# Patient Record
Sex: Female | Born: 1965 | Race: Black or African American | Hispanic: Refuse to answer | Marital: Married | Smoking: Never smoker
Health system: Southern US, Community
[De-identification: ages and names within clinical notes are randomized; demographics above are authoritative.]

## PROBLEM LIST (undated history)

## (undated) DIAGNOSIS — L309 Dermatitis, unspecified: Secondary | ICD-10-CM

## (undated) HISTORY — PX: BREAST EXCISIONAL BIOPSY: SUR124

## (undated) HISTORY — PX: BREAST BIOPSY: SHX20

## (undated) HISTORY — PX: NO PAST SURGERIES: SHX2092

---

## 2004-02-14 ENCOUNTER — Emergency Department (HOSPITAL_COMMUNITY): Admission: EM | Admit: 2004-02-14 | Discharge: 2004-02-14 | Payer: Self-pay | Admitting: Family Medicine

## 2004-07-18 ENCOUNTER — Emergency Department (HOSPITAL_COMMUNITY): Admission: EM | Admit: 2004-07-18 | Discharge: 2004-07-18 | Payer: Self-pay | Admitting: Family Medicine

## 2005-05-08 ENCOUNTER — Other Ambulatory Visit: Admission: RE | Admit: 2005-05-08 | Discharge: 2005-05-08 | Payer: Self-pay | Admitting: Family Medicine

## 2005-05-23 ENCOUNTER — Ambulatory Visit (HOSPITAL_COMMUNITY): Admission: RE | Admit: 2005-05-23 | Discharge: 2005-05-23 | Payer: Self-pay | Admitting: Family Medicine

## 2006-05-27 ENCOUNTER — Emergency Department (HOSPITAL_COMMUNITY): Admission: EM | Admit: 2006-05-27 | Discharge: 2006-05-27 | Payer: Self-pay | Admitting: Family Medicine

## 2007-03-02 ENCOUNTER — Other Ambulatory Visit: Admission: RE | Admit: 2007-03-02 | Discharge: 2007-03-02 | Payer: Self-pay | Admitting: Family Medicine

## 2007-03-26 ENCOUNTER — Emergency Department (HOSPITAL_COMMUNITY): Admission: EM | Admit: 2007-03-26 | Discharge: 2007-03-26 | Payer: Self-pay | Admitting: Emergency Medicine

## 2007-04-06 ENCOUNTER — Encounter: Admission: RE | Admit: 2007-04-06 | Discharge: 2007-04-06 | Payer: Self-pay | Admitting: Family Medicine

## 2009-05-29 ENCOUNTER — Encounter: Admission: RE | Admit: 2009-05-29 | Discharge: 2009-05-29 | Payer: Self-pay | Admitting: Family Medicine

## 2009-09-25 ENCOUNTER — Other Ambulatory Visit: Admission: RE | Admit: 2009-09-25 | Discharge: 2009-09-25 | Payer: Self-pay | Admitting: Family Medicine

## 2010-04-08 ENCOUNTER — Encounter: Payer: Self-pay | Admitting: Family Medicine

## 2011-05-07 ENCOUNTER — Emergency Department (INDEPENDENT_AMBULATORY_CARE_PROVIDER_SITE_OTHER): Payer: PRIVATE HEALTH INSURANCE

## 2011-05-07 ENCOUNTER — Encounter (HOSPITAL_COMMUNITY): Payer: Self-pay | Admitting: Emergency Medicine

## 2011-05-07 ENCOUNTER — Emergency Department (INDEPENDENT_AMBULATORY_CARE_PROVIDER_SITE_OTHER)
Admission: EM | Admit: 2011-05-07 | Discharge: 2011-05-07 | Disposition: A | Discharge status: Home / Self Care | Payer: PRIVATE HEALTH INSURANCE | Source: Home / Self Care | Attending: Emergency Medicine | Admitting: Emergency Medicine

## 2011-05-07 DIAGNOSIS — N200 Calculus of kidney: Secondary | ICD-10-CM

## 2011-05-07 LAB — POCT URINALYSIS DIP (DEVICE)
Leukocytes, UA: NEGATIVE
Protein, ur: NEGATIVE mg/dL
Specific Gravity, Urine: 1.025 (ref 1.005–1.030)
pH: 6 (ref 5.0–8.0)

## 2011-05-07 LAB — POCT PREGNANCY, URINE: Preg Test, Ur: NEGATIVE

## 2011-05-07 MED ORDER — KETOROLAC TROMETHAMINE 60 MG/2ML IM SOLN: 60.0000 mg | Freq: Once | INTRAMUSCULAR | Status: DC

## 2011-05-07 MED ORDER — OXYCODONE-ACETAMINOPHEN 5-325 MG PO TABS: ORAL_TABLET | ORAL | Status: AC

## 2011-05-07 MED ORDER — ONDANSETRON 8 MG PO TBDP: 8.0000 mg | ORAL_TABLET | Freq: Three times a day (TID) | ORAL | Status: AC | PRN

## 2011-05-07 MED ORDER — TAMSULOSIN HCL 0.4 MG PO CAPS: 0.4000 mg | ORAL_CAPSULE | Freq: Every day | ORAL | Status: DC

## 2011-05-07 NOTE — ED Notes (Signed)
PT HERE WITH LEFT FLANK/BACK CONSTANT,SHARP PAINS THAT STARTED LAST NIGHT UNRELIEVED BY STRETCHING OR HEAT.NAUSEA ALSO REPORTED BUT NO FEVERS.C/O FREQ/URGE WITH URINATION BUT DENIES BURN OR ITCH,VAG D/C

## 2011-05-07 NOTE — ED Provider Notes (Signed)
Chief Complaint  Patient presents with  . Urinary Tract Infection  . Back Pain    History of Present Illness:   Natasha Donovan is a 46 year old female who last night noted a sudden onset of severe left CVA pain rated 9/10 in intensity which radiated toward her groin. She felt somewhat nauseated but did not vomit. She denies any fever or chills. She has had no dysuria, frequency, urgency, or hematuria. She's had or GYN complaints. Her menses have been slightly irregular but she attributes this to be perimenopausal. She denies any prior history of kidney stones or infections.  Review of Systems:  Other than noted above, the patient denies any of the following symptoms: General:  No fevers, chills, sweats, aches, or fatigue. GI:  No abdominal pain, back pain, nausea, vomiting, diarrhea, or constipation. GU:  No dysuria, frequency, urgency, hematuria, or incontinence. GYN:  No discharge, itching, vulvar pain or lesions, pelvic pain, or abnormal vaginal bleeding.  PMFSH:  Past medical history, family history, social history, meds, and allergies were reviewed.  Physical Exam:   Vital signs:  BP 139/80  Pulse 86  Temp(Src) 98 F (36.7 C) (Oral)  Resp 16  SpO2 100%  LMP 04/04/2011 Gen:  Alert, oriented, in no distress. Lungs:  Clear to auscultation, no wheezes, rales or rhonchi. Heart:  Regular rhythm, no gallop or murmer. Abdomen:  Flat and soft. There was slight suprapubic pain to palpation.  No guarding, or rebound.  No hepato-splenomegaly or mass.  Bowel sounds were normally active.  No hernia. Back:  Mild left CVA tenderness.  Skin:  Clear, warm and dry.  Labs:   Results for orders placed during the hospital encounter of 05/07/11  POCT URINALYSIS DIP (DEVICE)      Component Value Range   Glucose, UA NEGATIVE  NEGATIVE (mg/dL)   Bilirubin Urine NEGATIVE  NEGATIVE    Ketones, ur NEGATIVE  NEGATIVE (mg/dL)   Specific Gravity, Urine 1.025  1.005 - 1.030    Hgb urine dipstick MODERATE (*)  NEGATIVE    pH 6.0  5.0 - 8.0    Protein, ur NEGATIVE  NEGATIVE (mg/dL)   Urobilinogen, UA 0.2  0.0 - 1.0 (mg/dL)   Nitrite NEGATIVE  NEGATIVE    Leukocytes, UA NEGATIVE  NEGATIVE   POCT PREGNANCY, URINE      Component Value Range   Preg Test, Ur NEGATIVE  NEGATIVE      Assessment:  Diagnoses that have been ruled out:  None  Diagnoses that are still under consideration:  None  Final diagnoses:  Kidney stone     Plan:   1.  The following meds were prescribed:   New Prescriptions   ONDANSETRON (ZOFRAN ODT) 8 MG DISINTEGRATING TABLET    Take 1 tablet (8 mg total) by mouth every 8 (eight) hours as needed for nausea.   OXYCODONE-ACETAMINOPHEN (PERCOCET) 5-325 MG PER TABLET    1 to 2 tablets every 6 hours as needed for pain.   TAMSULOSIN HCL (FLOMAX) 0.4 MG CAPS    Take 1 capsule (0.4 mg total) by mouth daily.   2.  The patient was instructed in symptomatic care and handouts were given. 3.  The patient was told to return if becoming worse in any way, if no better in 3 or 4 days, and given some red flag symptoms that would indicate earlier return. 4.  The patient was told to get extra fluids, take medications as directed, and followup with a urologist if no improvement in 2  or 3 days.   Roque Lias, MD 05/07/11 [phone]

## 2011-05-07 NOTE — Discharge Instructions (Signed)

## 2011-05-08 LAB — URINE CULTURE
Colony Count: 25000
Culture  Setup Time: 201302191243

## 2011-05-13 ENCOUNTER — Ambulatory Visit
Admission: RE | Admit: 2011-05-13 | Discharge: 2011-05-13 | Disposition: A | Discharge status: Home / Self Care | Payer: PRIVATE HEALTH INSURANCE | Source: Ambulatory Visit | Attending: Family Medicine | Admitting: Family Medicine

## 2011-05-13 ENCOUNTER — Other Ambulatory Visit: Payer: Self-pay | Admitting: Family Medicine

## 2011-05-13 DIAGNOSIS — R1032 Left lower quadrant pain: Secondary | ICD-10-CM

## 2011-05-14 ENCOUNTER — Other Ambulatory Visit: Payer: Self-pay | Admitting: Family Medicine

## 2011-05-14 DIAGNOSIS — R109 Unspecified abdominal pain: Secondary | ICD-10-CM

## 2011-05-17 ENCOUNTER — Ambulatory Visit
Admission: RE | Admit: 2011-05-17 | Discharge: 2011-05-17 | Disposition: A | Discharge status: Home / Self Care | Payer: PRIVATE HEALTH INSURANCE | Source: Ambulatory Visit | Attending: Family Medicine | Admitting: Family Medicine

## 2011-05-17 DIAGNOSIS — R109 Unspecified abdominal pain: Secondary | ICD-10-CM

## 2011-05-17 MED ORDER — GADOBENATE DIMEGLUMINE 529 MG/ML IV SOLN
18.0000 mL | Freq: Once | INTRAVENOUS | Status: AC | PRN
  Administered 2011-05-17: 18 mL via INTRAVENOUS

## 2011-10-04 ENCOUNTER — Other Ambulatory Visit: Payer: Self-pay | Admitting: Gastroenterology

## 2011-10-04 DIAGNOSIS — R109 Unspecified abdominal pain: Secondary | ICD-10-CM

## 2011-10-18 ENCOUNTER — Ambulatory Visit
Admission: RE | Admit: 2011-10-18 | Discharge: 2011-10-18 | Disposition: A | Discharge status: Home / Self Care | Payer: PRIVATE HEALTH INSURANCE | Source: Ambulatory Visit | Attending: Gastroenterology | Admitting: Gastroenterology

## 2011-10-18 ENCOUNTER — Other Ambulatory Visit: Payer: Self-pay | Admitting: Obstetrics and Gynecology

## 2011-10-18 ENCOUNTER — Other Ambulatory Visit (HOSPITAL_COMMUNITY)
Admission: RE | Admit: 2011-10-18 | Discharge: 2011-10-18 | Disposition: A | Discharge status: Home / Self Care | Payer: PRIVATE HEALTH INSURANCE | Source: Ambulatory Visit | Attending: Obstetrics and Gynecology | Admitting: Obstetrics and Gynecology

## 2011-10-18 DIAGNOSIS — Z01419 Encounter for gynecological examination (general) (routine) without abnormal findings: Secondary | ICD-10-CM | POA: Insufficient documentation

## 2011-10-18 DIAGNOSIS — R109 Unspecified abdominal pain: Secondary | ICD-10-CM

## 2011-11-05 ENCOUNTER — Other Ambulatory Visit: Payer: Self-pay | Admitting: Obstetrics and Gynecology

## 2012-10-23 ENCOUNTER — Emergency Department (HOSPITAL_COMMUNITY)
Admission: EM | Admit: 2012-10-23 | Discharge: 2012-10-23 | Disposition: A | Discharge status: Home / Self Care | Payer: PRIVATE HEALTH INSURANCE | Attending: Emergency Medicine | Admitting: Emergency Medicine

## 2012-10-23 ENCOUNTER — Encounter (HOSPITAL_COMMUNITY): Payer: Self-pay

## 2012-10-23 DIAGNOSIS — R059 Cough, unspecified: Secondary | ICD-10-CM | POA: Insufficient documentation

## 2012-10-23 DIAGNOSIS — J069 Acute upper respiratory infection, unspecified: Secondary | ICD-10-CM | POA: Insufficient documentation

## 2012-10-23 DIAGNOSIS — J040 Acute laryngitis: Secondary | ICD-10-CM | POA: Insufficient documentation

## 2012-10-23 DIAGNOSIS — R05 Cough: Secondary | ICD-10-CM | POA: Insufficient documentation

## 2012-10-23 DIAGNOSIS — J029 Acute pharyngitis, unspecified: Secondary | ICD-10-CM | POA: Insufficient documentation

## 2012-10-23 DIAGNOSIS — R509 Fever, unspecified: Secondary | ICD-10-CM | POA: Insufficient documentation

## 2012-10-23 DIAGNOSIS — E119 Type 2 diabetes mellitus without complications: Secondary | ICD-10-CM | POA: Insufficient documentation

## 2012-10-23 DIAGNOSIS — Z79899 Other long term (current) drug therapy: Secondary | ICD-10-CM | POA: Insufficient documentation

## 2012-10-23 DIAGNOSIS — J3489 Other specified disorders of nose and nasal sinuses: Secondary | ICD-10-CM | POA: Insufficient documentation

## 2012-10-23 MED ORDER — MAGIC MOUTHWASH: 5.0000 mL | Freq: Four times a day (QID) | ORAL | Status: DC | PRN

## 2012-10-23 MED ORDER — GUAIFENESIN ER 600 MG PO TB12: 1200.0000 mg | ORAL_TABLET | Freq: Two times a day (BID) | ORAL | Status: DC | PRN

## 2012-10-23 MED ORDER — GUAIFENESIN ER 600 MG PO TB12
1200.0000 mg | ORAL_TABLET | Freq: Two times a day (BID) | ORAL | Status: DC | PRN
  Administered 2012-10-23: 1200 mg via ORAL
  Filled 2012-10-23: qty 2

## 2012-10-23 MED ORDER — GI COCKTAIL ~~LOC~~
30.0000 mL | Freq: Once | ORAL | Status: AC
  Administered 2012-10-23: 30 mL via ORAL
  Filled 2012-10-23: qty 30

## 2012-10-23 NOTE — ED Notes (Signed)
Pt states that she has had a sore throat x 1 week; pt states that she saw her MD on Tues and diagnosed with a virus but was told if did not get any better to get Z-Pack filled; pt began Z-Pack on Thurs, pt reports that she has taken the first dose of Z-Pack last om at 7pm; pt reports that her throat is not any better.

## 2012-10-23 NOTE — ED Notes (Signed)
Patient is alert and oriented x3.  She was given DC instructions and follow up visit instructions.  Patient gave verbal understanding. She was DC ambulatory under her own power to home.  V/S stable.  He was not showing any signs of distress on DC 

## 2012-10-23 NOTE — ED Provider Notes (Signed)
CSN: [id number]     Arrival date & time 10/23/12  0443 History     First MD Initiated Contact with Patient 10/23/12 0459     Chief Complaint  Patient presents with  . Sore Throat   (Consider location/radiation/quality/duration/timing/severity/associated sxs/prior Treatment) HPI 47 yo female presents to the ER from home with complaint of sore throat for one week.  Pt had some fevers a week ago, no recently.  Some laryngitis that is slowly resolving.  Pt was seen by MD on Tuesday, dx with virus and given rx for cough syrup and zpack to use if not improving.  She has been doing throat lozenges and hot tea. Pt feels that she is very congested,worse on the left.  Pt started zpack last night, reports she doesn't feel much better, like she is swallowing razor blades.  Past Medical History  Diagnosis Date  . Diabetes mellitus    History reviewed. No pertinent past surgical history. History reviewed. No pertinent family history. History  Substance Use Topics  . Smoking status: Never Smoker   . Smokeless tobacco: Not on file  . Alcohol Use: No   OB History   Grav Para Term Preterm Abortions TAB SAB Ect Mult Living                 Review of Systems  All other systems reviewed and are negative.    Allergies  Review of patient's allergies indicates no known allergies.  Home Medications   Current Outpatient Rx  Name  Route  Sig  Dispense  Refill  . Tamsulosin HCl (FLOMAX) 0.4 MG CAPS   Oral   Take 1 capsule (0.4 mg total) by mouth daily.   7 capsule   0    BP 164/80  Pulse 110  Temp(Src) 98.6 F (37 C) (Oral)  Resp 16  Ht 5\' 3"  (1.6 m)  Wt 200 lb (90.719 kg)  BMI 35.44 kg/m2  SpO2 97%  LMP 09/30/2012 Physical Exam  Nursing note and vitals reviewed. Constitutional: She is oriented to person, place, and time. She appears well-developed and well-nourished.  HENT:  Head: Normocephalic and atraumatic.  Right Ear: External ear normal.  Left Ear: External ear normal.   Nose: Nose normal.  Mouth/Throat: Oropharynx is clear and moist. No oropharyngeal exudate.  No erythema, exudate or swelling noted to posterior pharnyx  Eyes: Conjunctivae and EOM are normal. Pupils are equal, round, and reactive to light.  Neck: Normal range of motion. Neck supple. No JVD present. No tracheal deviation present. No thyromegaly present.  Mild ttp over left neck, no LAD or masses, no tracheal shift  Cardiovascular: Normal rate, regular rhythm, normal heart sounds and intact distal pulses.  Exam reveals no gallop and no friction rub.   No murmur heard. Pulmonary/Chest: Effort normal and breath sounds normal. No stridor. No respiratory distress. She has no wheezes. She has no rales. She exhibits no tenderness.  Abdominal: Soft. Bowel sounds are normal. She exhibits no distension and no mass. There is no tenderness. There is no rebound and no guarding.  Musculoskeletal: Normal range of motion. She exhibits no edema and no tenderness.  Lymphadenopathy:    She has no cervical adenopathy.  Neurological: She is alert and oriented to person, place, and time. She exhibits normal muscle tone. Coordination normal.  Skin: Skin is warm and dry. No rash noted. No erythema. No pallor.  Psychiatric: She has a normal mood and affect. Her behavior is normal. Judgment and thought content normal.  ED Course   Procedures (including critical care time)  Labs Reviewed - No data to display No results found. 1. Pharyngitis   2. Upper respiratory infection     MDM  47 yo female with sore throat x 1 week. She started abx yesterday.  No signs of strep on exam, no LAD.  Will try mucinex and magic mouthwash for symptoms.  Olivia Mackie, MD 10/23/12 [phone]

## 2013-10-25 ENCOUNTER — Other Ambulatory Visit (HOSPITAL_COMMUNITY)
Admission: RE | Admit: 2013-10-25 | Discharge: 2013-10-25 | Disposition: A | Discharge status: Home / Self Care | Payer: PRIVATE HEALTH INSURANCE | Source: Ambulatory Visit | Attending: Family Medicine | Admitting: Family Medicine

## 2013-10-25 ENCOUNTER — Other Ambulatory Visit: Payer: Self-pay | Admitting: Family Medicine

## 2013-10-25 DIAGNOSIS — Z01419 Encounter for gynecological examination (general) (routine) without abnormal findings: Secondary | ICD-10-CM | POA: Insufficient documentation

## 2013-10-26 LAB — CYTOLOGY - PAP

## 2014-05-18 ENCOUNTER — Other Ambulatory Visit: Payer: Self-pay

## 2014-05-18 DIAGNOSIS — Z1231 Encounter for screening mammogram for malignant neoplasm of breast: Secondary | ICD-10-CM

## 2014-05-19 ENCOUNTER — Ambulatory Visit
Admission: RE | Admit: 2014-05-19 | Discharge: 2014-05-19 | Disposition: A | Discharge status: Home / Self Care | Payer: PRIVATE HEALTH INSURANCE | Source: Ambulatory Visit

## 2014-05-19 DIAGNOSIS — Z1231 Encounter for screening mammogram for malignant neoplasm of breast: Secondary | ICD-10-CM

## 2014-09-15 ENCOUNTER — Other Ambulatory Visit: Payer: Self-pay | Admitting: Family Medicine

## 2014-09-15 ENCOUNTER — Ambulatory Visit
Admission: RE | Admit: 2014-09-15 | Discharge: 2014-09-15 | Disposition: A | Discharge status: Home / Self Care | Payer: PRIVATE HEALTH INSURANCE | Source: Ambulatory Visit | Attending: Family Medicine | Admitting: Family Medicine

## 2014-09-15 DIAGNOSIS — M542 Cervicalgia: Secondary | ICD-10-CM

## 2014-09-15 DIAGNOSIS — M549 Dorsalgia, unspecified: Secondary | ICD-10-CM

## 2015-06-06 ENCOUNTER — Other Ambulatory Visit: Payer: Self-pay | Admitting: Family Medicine

## 2015-06-06 ENCOUNTER — Other Ambulatory Visit (HOSPITAL_COMMUNITY)
Admission: RE | Admit: 2015-06-06 | Discharge: 2015-06-06 | Disposition: A | Discharge status: Home / Self Care | Payer: PRIVATE HEALTH INSURANCE | Source: Ambulatory Visit | Attending: Family Medicine | Admitting: Family Medicine

## 2015-06-06 DIAGNOSIS — Z113 Encounter for screening for infections with a predominantly sexual mode of transmission: Secondary | ICD-10-CM | POA: Insufficient documentation

## 2015-06-06 DIAGNOSIS — Z01419 Encounter for gynecological examination (general) (routine) without abnormal findings: Secondary | ICD-10-CM | POA: Insufficient documentation

## 2015-06-09 LAB — CYTOLOGY - PAP

## 2015-10-09 ENCOUNTER — Other Ambulatory Visit: Payer: Self-pay

## 2015-10-09 DIAGNOSIS — N6452 Nipple discharge: Secondary | ICD-10-CM

## 2015-10-10 ENCOUNTER — Other Ambulatory Visit: Payer: Self-pay | Admitting: Family Medicine

## 2015-10-10 DIAGNOSIS — N6452 Nipple discharge: Secondary | ICD-10-CM

## 2015-10-17 ENCOUNTER — Ambulatory Visit
Admission: RE | Admit: 2015-10-17 | Discharge: 2015-10-17 | Disposition: A | Discharge status: Home / Self Care | Payer: PRIVATE HEALTH INSURANCE | Source: Ambulatory Visit | Attending: Family Medicine | Admitting: Family Medicine

## 2015-10-17 DIAGNOSIS — N6452 Nipple discharge: Secondary | ICD-10-CM

## 2015-10-27 ENCOUNTER — Other Ambulatory Visit: Payer: Self-pay | Admitting: General Surgery

## 2015-11-16 ENCOUNTER — Encounter (HOSPITAL_BASED_OUTPATIENT_CLINIC_OR_DEPARTMENT_OTHER): Payer: Self-pay | Admitting: *Deleted

## 2015-11-23 NOTE — Progress Notes (Signed)
Pt given 8oz carton of boost breeze with written and verbal instructions to drink by 845 morning of surgery and no other liquids after midnight , pt voiced understanding , teach back used

## 2015-11-28 ENCOUNTER — Encounter (HOSPITAL_BASED_OUTPATIENT_CLINIC_OR_DEPARTMENT_OTHER): Payer: Self-pay | Admitting: *Deleted

## 2015-11-28 ENCOUNTER — Ambulatory Visit (HOSPITAL_BASED_OUTPATIENT_CLINIC_OR_DEPARTMENT_OTHER): Payer: No Typology Code available for payment source | Admitting: Anesthesiology

## 2015-11-28 ENCOUNTER — Encounter (HOSPITAL_BASED_OUTPATIENT_CLINIC_OR_DEPARTMENT_OTHER)
Admission: RE | Disposition: A | Discharge status: Home / Self Care | Payer: Self-pay | Source: Ambulatory Visit | Attending: General Surgery

## 2015-11-28 ENCOUNTER — Ambulatory Visit (HOSPITAL_BASED_OUTPATIENT_CLINIC_OR_DEPARTMENT_OTHER)
Admission: RE | Admit: 2015-11-28 | Discharge: 2015-11-28 | Disposition: A | Discharge status: Home / Self Care | Payer: No Typology Code available for payment source | Source: Ambulatory Visit | Attending: General Surgery | Admitting: General Surgery

## 2015-11-28 DIAGNOSIS — Z6836 Body mass index (BMI) 36.0-36.9, adult: Secondary | ICD-10-CM | POA: Insufficient documentation

## 2015-11-28 DIAGNOSIS — E669 Obesity, unspecified: Secondary | ICD-10-CM | POA: Insufficient documentation

## 2015-11-28 DIAGNOSIS — D241 Benign neoplasm of right breast: Secondary | ICD-10-CM | POA: Insufficient documentation

## 2015-11-28 DIAGNOSIS — N6452 Nipple discharge: Secondary | ICD-10-CM | POA: Insufficient documentation

## 2015-11-28 HISTORY — PX: BREAST DUCTAL SYSTEM EXCISION: SHX5242

## 2015-11-28 HISTORY — DX: Dermatitis, unspecified: L30.9

## 2015-11-28 SURGERY — EXCISION DUCTAL SYSTEM BREAST
Anesthesia: General | Site: Breast | Laterality: Right

## 2015-11-28 MED ORDER — OXYCODONE HCL 5 MG PO TABS: 5.0000 mg | ORAL_TABLET | Freq: Four times a day (QID) | ORAL | 0 refills | Status: DC | PRN

## 2015-11-28 MED ORDER — LIDOCAINE-EPINEPHRINE (PF) 1 %-1:200000 IJ SOLN
INTRAMUSCULAR | Status: DC | PRN
  Administered 2015-11-28: 10 mL

## 2015-11-28 MED ORDER — CEFAZOLIN SODIUM-DEXTROSE 2-4 GM/100ML-% IV SOLN
2.0000 g | INTRAVENOUS | Status: AC
  Administered 2015-11-28: 2 g via INTRAVENOUS

## 2015-11-28 MED ORDER — DEXAMETHASONE SODIUM PHOSPHATE 10 MG/ML IJ SOLN
INTRAMUSCULAR | Status: AC
  Filled 2015-11-28: qty 1

## 2015-11-28 MED ORDER — CELECOXIB 200 MG PO CAPS
ORAL_CAPSULE | ORAL | Status: AC
  Filled 2015-11-28: qty 2

## 2015-11-28 MED ORDER — CHLORHEXIDINE GLUCONATE CLOTH 2 % EX PADS: 6.0000 | MEDICATED_PAD | Freq: Once | CUTANEOUS | Status: DC

## 2015-11-28 MED ORDER — ACETAMINOPHEN 500 MG PO TABS
ORAL_TABLET | ORAL | Status: AC
  Filled 2015-11-28: qty 2

## 2015-11-28 MED ORDER — ACETAMINOPHEN 500 MG PO TABS
1000.0000 mg | ORAL_TABLET | ORAL | Status: AC
  Administered 2015-11-28: 1000 mg via ORAL

## 2015-11-28 MED ORDER — MIDAZOLAM HCL 2 MG/2ML IJ SOLN
INTRAMUSCULAR | Status: AC
  Filled 2015-11-28: qty 2

## 2015-11-28 MED ORDER — PROMETHAZINE HCL 25 MG/ML IJ SOLN: 6.2500 mg | INTRAMUSCULAR | Status: DC | PRN

## 2015-11-28 MED ORDER — PROPOFOL 500 MG/50ML IV EMUL
INTRAVENOUS | Status: AC
  Filled 2015-11-28: qty 50

## 2015-11-28 MED ORDER — GLYCOPYRROLATE 0.2 MG/ML IJ SOLN: 0.2000 mg | Freq: Once | INTRAMUSCULAR | Status: DC | PRN

## 2015-11-28 MED ORDER — FENTANYL CITRATE (PF) 100 MCG/2ML IJ SOLN
INTRAMUSCULAR | Status: DC | PRN
  Administered 2015-11-28: 25 ug via INTRAVENOUS
  Administered 2015-11-28: 100 ug via INTRAVENOUS
  Administered 2015-11-28: 25 ug via INTRAVENOUS

## 2015-11-28 MED ORDER — CEFAZOLIN SODIUM-DEXTROSE 2-4 GM/100ML-% IV SOLN
INTRAVENOUS | Status: AC
  Filled 2015-11-28: qty 100

## 2015-11-28 MED ORDER — ONDANSETRON HCL 4 MG/2ML IJ SOLN
INTRAMUSCULAR | Status: DC | PRN
  Administered 2015-11-28: 4 mg via INTRAVENOUS

## 2015-11-28 MED ORDER — MIDAZOLAM HCL 5 MG/5ML IJ SOLN
INTRAMUSCULAR | Status: DC | PRN
  Administered 2015-11-28: 2 mg via INTRAVENOUS

## 2015-11-28 MED ORDER — SODIUM CHLORIDE 0.9 % IJ SOLN
INTRAMUSCULAR | Status: AC
  Filled 2015-11-28: qty 10

## 2015-11-28 MED ORDER — FENTANYL CITRATE (PF) 100 MCG/2ML IJ SOLN: 25.0000 ug | INTRAMUSCULAR | Status: DC | PRN

## 2015-11-28 MED ORDER — CEFAZOLIN SODIUM 1 G IJ SOLR
INTRAMUSCULAR | Status: AC
  Filled 2015-11-28: qty 20

## 2015-11-28 MED ORDER — BUPIVACAINE-EPINEPHRINE (PF) 0.25% -1:200000 IJ SOLN
INTRAMUSCULAR | Status: AC
  Filled 2015-11-28: qty 30

## 2015-11-28 MED ORDER — LIDOCAINE-EPINEPHRINE (PF) 1 %-1:200000 IJ SOLN
INTRAMUSCULAR | Status: AC
  Filled 2015-11-28: qty 30

## 2015-11-28 MED ORDER — FENTANYL CITRATE (PF) 100 MCG/2ML IJ SOLN: 50.0000 ug | INTRAMUSCULAR | Status: DC | PRN

## 2015-11-28 MED ORDER — SCOPOLAMINE 1 MG/3DAYS TD PT72: 1.0000 | MEDICATED_PATCH | Freq: Once | TRANSDERMAL | Status: DC | PRN

## 2015-11-28 MED ORDER — ONDANSETRON HCL 4 MG/2ML IJ SOLN
INTRAMUSCULAR | Status: AC
  Filled 2015-11-28: qty 2

## 2015-11-28 MED ORDER — FENTANYL CITRATE (PF) 100 MCG/2ML IJ SOLN
INTRAMUSCULAR | Status: AC
  Filled 2015-11-28: qty 2

## 2015-11-28 MED ORDER — CELECOXIB 400 MG PO CAPS
400.0000 mg | ORAL_CAPSULE | ORAL | Status: AC
  Administered 2015-11-28: 400 mg via ORAL

## 2015-11-28 MED ORDER — MIDAZOLAM HCL 2 MG/2ML IJ SOLN: 1.0000 mg | INTRAMUSCULAR | Status: DC | PRN

## 2015-11-28 MED ORDER — BUPIVACAINE HCL (PF) 0.25 % IJ SOLN
INTRAMUSCULAR | Status: DC | PRN
  Administered 2015-11-28: 10 mL

## 2015-11-28 MED ORDER — BUPIVACAINE HCL (PF) 0.25 % IJ SOLN
INTRAMUSCULAR | Status: AC
  Filled 2015-11-28: qty 30

## 2015-11-28 MED ORDER — PROPOFOL 10 MG/ML IV BOLUS
INTRAVENOUS | Status: DC | PRN
  Administered 2015-11-28: 200 mg via INTRAVENOUS

## 2015-11-28 MED ORDER — LACTATED RINGERS IV SOLN
INTRAVENOUS | Status: DC
  Administered 2015-11-28 (×2): via INTRAVENOUS

## 2015-11-28 MED ORDER — LIDOCAINE HCL (CARDIAC) 20 MG/ML IV SOLN
INTRAVENOUS | Status: DC | PRN
Start: 2015-11-28 — End: 2015-11-28
  Administered 2015-11-28: 50 mg via INTRAVENOUS

## 2015-11-28 MED ORDER — GABAPENTIN 300 MG PO CAPS
300.0000 mg | ORAL_CAPSULE | ORAL | Status: AC
  Administered 2015-11-28: 300 mg via ORAL

## 2015-11-28 MED ORDER — DEXAMETHASONE SODIUM PHOSPHATE 4 MG/ML IJ SOLN
INTRAMUSCULAR | Status: DC | PRN
  Administered 2015-11-28: 10 mg via INTRAVENOUS

## 2015-11-28 MED ORDER — LIDOCAINE 2% (20 MG/ML) 5 ML SYRINGE
INTRAMUSCULAR | Status: AC
  Filled 2015-11-28: qty 5

## 2015-11-28 MED ORDER — GABAPENTIN 300 MG PO CAPS
ORAL_CAPSULE | ORAL | Status: AC
  Filled 2015-11-28: qty 1

## 2015-11-28 SURGICAL SUPPLY — 47 items
BLADE HEX COATED 2.75 (ELECTRODE) ×3 IMPLANT
BLADE SURG 15 STRL LF DISP TIS (BLADE) ×1 IMPLANT
BLADE SURG 15 STRL SS (BLADE) ×2
CANISTER SUCT 1200ML W/VALVE (MISCELLANEOUS) ×3 IMPLANT
CHLORAPREP W/TINT 26ML (MISCELLANEOUS) ×3 IMPLANT
CLIP TI LARGE 6 (CLIP) IMPLANT
CLOSURE WOUND 1/2 X4 (GAUZE/BANDAGES/DRESSINGS) ×1
COVER BACK TABLE 60X90IN (DRAPES) ×3 IMPLANT
COVER MAYO STAND STRL (DRAPES) ×3 IMPLANT
DECANTER SPIKE VIAL GLASS SM (MISCELLANEOUS) IMPLANT
DEVICE DUBIN W/COMP PLATE 8390 (MISCELLANEOUS) IMPLANT
DRAPE LAPAROTOMY 100X72 PEDS (DRAPES) ×3 IMPLANT
DRAPE UTILITY XL STRL (DRAPES) ×3 IMPLANT
ELECT REM PT RETURN 9FT ADLT (ELECTROSURGICAL) ×3
ELECTRODE REM PT RTRN 9FT ADLT (ELECTROSURGICAL) ×1 IMPLANT
GLOVE BIO SURGEON STRL SZ 6 (GLOVE) IMPLANT
GLOVE BIOGEL PI IND STRL 6.5 (GLOVE) ×1 IMPLANT
GLOVE BIOGEL PI INDICATOR 6.5 (GLOVE) ×2
GOWN STRL REUS W/ TWL LRG LVL3 (GOWN DISPOSABLE) IMPLANT
GOWN STRL REUS W/TWL 2XL LVL3 (GOWN DISPOSABLE) ×6 IMPLANT
GOWN STRL REUS W/TWL LRG LVL3 (GOWN DISPOSABLE)
ILLUMINATOR WAVEGUIDE N/F (MISCELLANEOUS) IMPLANT
LIGHT WAVEGUIDE WIDE FLAT (MISCELLANEOUS) ×3 IMPLANT
LIQUID BAND (GAUZE/BANDAGES/DRESSINGS) ×3 IMPLANT
NEEDLE HYPO 25X1 1.5 SAFETY (NEEDLE) ×3 IMPLANT
NS IRRIG 1000ML POUR BTL (IV SOLUTION) ×3 IMPLANT
PACK BASIN DAY SURGERY FS (CUSTOM PROCEDURE TRAY) ×3 IMPLANT
PENCIL BUTTON HOLSTER BLD 10FT (ELECTRODE) ×3 IMPLANT
SLEEVE SCD COMPRESS KNEE MED (MISCELLANEOUS) ×3 IMPLANT
SPONGE GAUZE 4X4 12PLY STER LF (GAUZE/BANDAGES/DRESSINGS) ×3 IMPLANT
SPONGE LAP 18X18 X RAY DECT (DISPOSABLE) ×3 IMPLANT
STAPLER VISISTAT 35W (STAPLE) IMPLANT
STRIP CLOSURE SKIN 1/2X4 (GAUZE/BANDAGES/DRESSINGS) ×2 IMPLANT
SUT MON AB 4-0 PC3 18 (SUTURE) ×3 IMPLANT
SUT SILK 2 0 SH (SUTURE) ×3 IMPLANT
SUT VIC AB 2-0 SH 27 (SUTURE) ×2
SUT VIC AB 2-0 SH 27XBRD (SUTURE) ×1 IMPLANT
SUT VIC AB 3-0 54X BRD REEL (SUTURE) IMPLANT
SUT VIC AB 3-0 BRD 54 (SUTURE)
SUT VIC AB 3-0 SH 27 (SUTURE) ×2
SUT VIC AB 3-0 SH 27X BRD (SUTURE) ×1 IMPLANT
SYR CONTROL 10ML LL (SYRINGE) ×3 IMPLANT
TOWEL OR 17X24 6PK STRL BLUE (TOWEL DISPOSABLE) ×3 IMPLANT
TOWEL OR NON WOVEN STRL DISP B (DISPOSABLE) ×3 IMPLANT
TUBE CONNECTING 20'X1/4 (TUBING) ×1
TUBE CONNECTING 20X1/4 (TUBING) ×2 IMPLANT
YANKAUER SUCT BULB TIP NO VENT (SUCTIONS) ×3 IMPLANT

## 2015-11-28 NOTE — Anesthesia Postprocedure Evaluation (Signed)
Anesthesia Post Note  Patient: Natasha Donovan  Procedure(s) Performed: Procedure(s) (LRB): EXCISION OF RIGHT BREAST MAJOR DUCT (Right)  Patient location during evaluation: PACU Anesthesia Type: General Level of consciousness: awake and alert Pain management: pain level controlled Vital Signs Assessment: post-procedure vital signs reviewed and stable Respiratory status: spontaneous breathing, nonlabored ventilation, respiratory function stable and patient connected to nasal cannula oxygen Cardiovascular status: blood pressure returned to baseline and stable Postop Assessment: no signs of nausea or vomiting Anesthetic complications: no    Last Vitals:  Vitals:   11/28/15 1445 11/28/15 1518  BP: (!) 150/79   Pulse: 78 66  Resp: 16   Temp:  36.4 C    Last Pain:  Vitals:   11/28/15 1518  TempSrc:   PainSc: 0-No pain                 Catalina Gravel

## 2015-11-28 NOTE — Anesthesia Preprocedure Evaluation (Addendum)
Anesthesia Evaluation  Patient identified by MRN, date of birth, ID band Patient awake    Reviewed: Allergy & Precautions, NPO status , Patient's Chart, lab work & pertinent test results  Airway Mallampati: III  TM Distance: >3 FB Neck ROM: Full    Dental  (+) Teeth Intact, Dental Advisory Given   Pulmonary neg pulmonary ROS,    Pulmonary exam normal breath sounds clear to auscultation       Cardiovascular negative cardio ROS Normal cardiovascular exam Rhythm:Regular Rate:Normal     Neuro/Psych negative neurological ROS  negative psych ROS   GI/Hepatic negative GI ROS, Neg liver ROS,   Endo/Other  Obesity   Renal/GU negative Renal ROS     Musculoskeletal negative musculoskeletal ROS (+)   Abdominal   Peds  Hematology negative hematology ROS (+)   Anesthesia Other Findings Day of surgery medications reviewed with the patient.  Reproductive/Obstetrics Bloody nipple discharge                            Anesthesia Physical Anesthesia Plan  ASA: II  Anesthesia Plan: General   Post-op Pain Management:    Induction: Intravenous  Airway Management Planned: LMA  Additional Equipment:   Intra-op Plan:   Post-operative Plan: Extubation in OR  Informed Consent: I have reviewed the patients History and Physical, chart, labs and discussed the procedure including the risks, benefits and alternatives for the proposed anesthesia with the patient or authorized representative who has indicated his/her understanding and acceptance.   Dental advisory given  Plan Discussed with: CRNA  Anesthesia Plan Comments: (Risks/benefits of general anesthesia discussed with patient including risk of damage to teeth, lips, gum, and tongue, nausea/vomiting, allergic reactions to medications, and the possibility of heart attack, stroke and death.  All patient questions answered.  Patient wishes to proceed.)         Anesthesia Quick Evaluation

## 2015-11-28 NOTE — Discharge Instructions (Addendum)
[facility] °Office Phone Number [phone] ° °BREAST BIOPSY/ PARTIAL MASTECTOMY: POST OP INSTRUCTIONS ° °Always review your discharge instruction sheet given to you by the facility where your surgery was performed. ° °IF YOU HAVE DISABILITY OR FAMILY LEAVE FORMS, YOU MUST BRING THEM TO THE OFFICE FOR PROCESSING.  DO NOT GIVE THEM TO YOUR DOCTOR. ° °1. A prescription for pain medication may be given to you upon discharge.  Take your pain medication as prescribed, if needed.  If narcotic pain medicine is not needed, then you may take acetaminophen (Tylenol) or ibuprofen (Advil) as needed. °2. Take your usually prescribed medications unless otherwise directed °3. If you need a refill on your pain medication, please contact your pharmacy.  They will contact our office to request authorization.  Prescriptions will not be filled after 5pm or on week-ends. °4. You should eat very light the first 24 hours after surgery, such as soup, crackers, pudding, etc.  Resume your normal diet the day after surgery. °5. Most patients will experience some swelling and bruising in the breast.  Ice packs and a good support bra will help.  Swelling and bruising can take several days to resolve.  °6. It is common to experience some constipation if taking pain medication after surgery.  Increasing fluid intake and taking a stool softener will usually help or prevent this problem from occurring.  A mild laxative (Milk of Magnesia or Miralax) should be taken according to package directions if there are no bowel movements after 48 hours. °7. Unless discharge instructions indicate otherwise, you may remove your bandages 48 hours after surgery, and you may shower at that time.  You may have steri-strips (small skin tapes) in place directly over the incision.  These strips should be left on the skin for 7-10 days.   Any sutures or staples will be removed at the office during your follow-up visit. °8. ACTIVITIES:  You may resume  regular daily activities (gradually increasing) beginning the next day.  Wearing a good support bra or sports bra (or the breast binder) minimizes pain and swelling.  You may have sexual intercourse when it is comfortable. °a. You may drive when you no longer are taking prescription pain medication, you can comfortably wear a seatbelt, and you can safely maneuver your car and apply brakes. °b. RETURN TO WORK:  __________1 week_______________ °9. You should see your doctor in the office for a follow-up appointment approximately two weeks after your surgery.  Your doctor’s nurse will typically make your follow-up appointment when she calls you with your pathology report.  Expect your pathology report 2-3 business days after your surgery.  You may call to check if you do not hear from us after three days. ° ° °WHEN TO CALL YOUR DOCTOR: °1. Fever over 101.0 °2. Nausea and/or vomiting. °3. Extreme swelling or bruising. °4. Continued bleeding from incision. °5. Increased pain, redness, or drainage from the incision. ° °The clinic staff is available to answer your questions during regular business hours.  Please don’t hesitate to call and ask to speak to one of the nurses for clinical concerns.  If you have a medical emergency, go to the nearest emergency room or call 911.  A surgeon from [facility] is always on call at the hospital. ° °For further questions, please visit [email or web address]  ° ° °Post Anesthesia Home Care Instructions ° °Activity: °Get plenty of rest for the remainder of the day. A responsible adult should stay with you for 24   hours following the procedure.  °For the next 24 hours, DO NOT: °-Drive a car °-Operate machinery °-Drink alcoholic beverages °-Take any medication unless instructed by your physician °-Make any legal decisions or sign important papers. ° °Meals: °Start with liquid foods such as gelatin or soup. Progress to regular foods as tolerated. Avoid greasy, spicy, heavy  foods. If nausea and/or vomiting occur, drink only clear liquids until the nausea and/or vomiting subsides. Call your physician if vomiting continues. ° °Special Instructions/Symptoms: °Your throat may feel dry or sore from the anesthesia or the breathing tube placed in your throat during surgery. If this causes discomfort, gargle with warm salt water. The discomfort should disappear within 24 hours. ° °If you had a scopolamine patch placed behind your ear for the management of post- operative nausea and/or vomiting: ° °1. The medication in the patch is effective for 72 hours, after which it should be removed.  Wrap patch in a tissue and discard in the trash. Wash hands thoroughly with soap and water. °2. You may remove the patch earlier than 72 hours if you experience unpleasant side effects which may include dry mouth, dizziness or visual disturbances. °3. Avoid touching the patch. Wash your hands with soap and water after contact with the patch. °  ° °

## 2015-11-28 NOTE — Op Note (Signed)
Right major duct excision  Indications: This patient presents with history of right bloody nipple discharge and abnormal ultrasound  Pre-operative Diagnosis: right bloody nipple discharge  Post-operative Diagnosis: right bloody nipple discharge  Surgeon: Stark Klein   Anesthesia: General LMA anesthesia  ASA Class: 2  Procedure Details  The patient was seen in the Holding Room. The risks, benefits, complications, treatment options, and expected outcomes were discussed with the patient. The possibilities of reaction to medication, pulmonary aspiration, bleeding, infection, the need for additional procedures, failure to diagnose a condition, and creating a complication requiring transfusion or operation were discussed with the patient. The patient concurred with the proposed plan, giving informed consent.  The site of surgery properly noted/marked. The patient was taken to Operating Room # 2, identified, and the procedure verified as Breast Excisional Biopsy. A Time Out was held and the above information confirmed.  After induction of anesthesia, the right  breast and chest were prepped and draped in standard fashion. The duct with the discharge was cannulated with a lacrimal duct probe.  A circumareolar incision was made over the  lower nipple .  Dissection was carried down around the probe. The specimen was marked with the margin marker paint kit.  Hemostasis was achieved with cautery.  The wound was irrigated and closed with a 3-0 Vicryl interrupted deep dermal stitch and a 4-0 Monocryl subcuticular closure in layers.    Sterile dressings were applied. At the end of the operation, all sponge, instrument, and needle counts were correct.  Findings: grossly clear surgical margins  Estimated Blood Loss:  Minimal           Specimens: right major duct excision         Complications:  None; patient tolerated the procedure well.         Disposition: PACU - hemodynamically stable.          Condition: stable

## 2015-11-28 NOTE — H&P (Signed)
Natasha Donovan  Location: [facility] Patient #: [id number] DOB: 1965/10/08 Single / Language: Cleophus Molt / Race: Black or African American Female   History of Present Illness  Patient words: new pt, right nipple discharge.  The patient is a 50 year old female who presents with a complaint of nipple discharge. Pt is a 50 yo F referred by Dr. Joneen Caraway for consultation regarding bloody nipple discharge on the right. She has had clearish/yellowish drainage on her bra for around 7 months. When she went for imaging, she was seen to have bloody discharge after mammogram. Dx imaging showed concern for possible mass in the duct. She is here for surgical consultation. She denies any spontaneous bloody discharge, but can easily get the clear drainage to come out. She does have a family history of breast cancer, with her mother having lymphoma and both of her grandmothers having breast cancer at a young age. She is a G0P0 wtih menarche at age 24.   dx mammo/ultrasound IMPRESSION: Pathological spontaneous, single duct discharge from the right nipple associated with a single dilated duct just behind the nipple, containing a 4 x 3 2 x 2 mm mass or area of inspissated secretions. This is concerning for an intraductal mass such as a papilloma. Due to its position just behind the shadowing right nipple, this would be difficult to biopsy under ultrasound guidance. Therefore, surgical consultation for excision is recommended.  RECOMMENDATION: Surgical consultation for right retronipple duct excision.    Other Problems Thyroid Disease  Past Surgical History  No pertinent past surgical history  Diagnostic Studies History  Colonoscopy never Mammogram within last year Pap Smear 1-5 years ago  Allergies Latex Gloves *MEDICAL DEVICES AND SUPPLIES*  Medication History No Current Medications Medications Reconciled  Social History Alcohol use Occasional alcohol use. Caffeine  use Coffee, Tea. No drug use Tobacco use Never smoker.  Family History Breast Cancer Family Members In General. Cancer Mother. Diabetes Mellitus Father, Mother. Hypertension Mother.  Pregnancy / Birth History Age at menarche 89 years. Gravida 0 Irregular periods Para 0    Review of Systems General Not Present- Appetite Loss, Chills, Fatigue, Fever, Night Sweats, Weight Gain and Weight Loss. Skin Present- Rash. Not Present- Change in Wart/Mole, Dryness, Hives, Jaundice, New Lesions, Non-Healing Wounds and Ulcer. HEENT Present- Seasonal Allergies. Not Present- Earache, Hearing Loss, Hoarseness, Nose Bleed, Oral Ulcers, Ringing in the Ears, Sinus Pain, Sore Throat, Visual Disturbances, Wears glasses/contact lenses and Yellow Eyes. Respiratory Not Present- Bloody sputum, Chronic Cough, Difficulty Breathing, Snoring and Wheezing. Breast Present- Breast Mass and Nipple Discharge. Not Present- Breast Pain and Skin Changes. Cardiovascular Present- Swelling of Extremities. Not Present- Chest Pain, Difficulty Breathing Lying Down, Leg Cramps, Palpitations, Rapid Heart Rate and Shortness of Breath. Gastrointestinal Not Present- Abdominal Pain, Bloating, Bloody Stool, Change in Bowel Habits, Chronic diarrhea, Constipation, Difficulty Swallowing, Excessive gas, Gets full quickly at meals, Hemorrhoids, Indigestion, Nausea, Rectal Pain and Vomiting. Female Genitourinary Not Present- Frequency, Nocturia, Painful Urination, Pelvic Pain and Urgency. Musculoskeletal Not Present- Back Pain, Joint Pain, Joint Stiffness, Muscle Pain, Muscle Weakness and Swelling of Extremities. Neurological Present- Tingling. Not Present- Decreased Memory, Fainting, Headaches, Numbness, Seizures, Tremor, Trouble walking and Weakness. Psychiatric Not Present- Anxiety, Bipolar, Change in Sleep Pattern, Depression, Fearful and Frequent crying. Endocrine Present- Hair Changes. Not Present- Cold Intolerance, Excessive  Hunger, Heat Intolerance, Hot flashes and New Diabetes. Hematology Not Present- Blood Thinners, Easy Bruising, Excessive bleeding, Gland problems, HIV and Persistent Infections.  Vitals  Weight: 205.25 lb  Height: 63in Height was reported by patient. Body Surface Area: 1.96 m Body Mass Index: 36.36 kg/m  Temp.: 97.63F(Oral)  Pulse: 74 (Regular)  P.OX: 98% (Room air) BP: 140/88 (Sitting, Left Arm, Standard)    Physical Exam  General Mental Status-Alert. General Appearance-Consistent with stated age. Hydration-Well hydrated. Voice-Normal.  Head and Neck Head-normocephalic, atraumatic with no lesions or palpable masses. Trachea-midline. Thyroid Gland Characteristics - normal size and consistency.  Eye Eyeball - Bilateral-Extraocular movements intact. Sclera/Conjunctiva - Bilateral-No scleral icterus.  Chest and Lung Exam Chest and lung exam reveals -quiet, even and easy respiratory effort with no use of accessory muscles and on auscultation, normal breath sounds, no adventitious sounds and normal vocal resonance. Inspection Chest Wall - Normal. Back - normal.  Breast Note: breasts symmetric bilaterally. No palpable masses or skin dimpling. No LAD. no nipple retraction. Small amt of clearish discharge expressible from right nipple.   Cardiovascular Cardiovascular examination reveals -normal heart sounds, regular rate and rhythm with no murmurs and normal pedal pulses bilaterally.  Abdomen Inspection Inspection of the abdomen reveals - No Hernias. Palpation/Percussion Palpation and Percussion of the abdomen reveal - Soft, Non Tender, No Rebound tenderness, No Rigidity (guarding) and No hepatosplenomegaly. Auscultation Auscultation of the abdomen reveals - Bowel sounds normal.  Neurologic Neurologic evaluation reveals -alert and oriented x 3 with no impairment of recent or remote memory. Mental  Status-Normal.  Musculoskeletal Global Assessment -Note: no gross deformities.  Normal Exam - Left-Upper Extremity Strength Normal and Lower Extremity Strength Normal. Normal Exam - Right-Upper Extremity Strength Normal and Lower Extremity Strength Normal.  Lymphatic Head & Neck  General Head & Neck Lymphatics: Bilateral - Description - Normal. Axillary  General Axillary Region: Bilateral - Description - Normal. Tenderness - Non Tender. Femoral & Inguinal  Generalized Femoral & Inguinal Lymphatics: Bilateral - Description - No Generalized lymphadenopathy.    Assessment & Plan BLOODY DISCHARGE FROM RIGHT NIPPLE [phone]) Impression: Patient will need a right major duct excision. I discussed with the patient what that entails. I reviewed that we would put her under general anesthesia and probe the duct with discharge. I then make a periareolar incision and traced the probe down to where it stops. We will then remove a cylinder of breast tissue. I discussed the risks of surgery including bleeding, infection, dissatisfaction with the scar, possible diagnosis of cancer, possible need for additional surgeries or procedures, possible her lung complications, possible anesthetic complications.  She understands and wishes to proceed. I reviewed post operative recovery and restrictions. She is a Herbalist with a desk job. Current Plans You are being scheduled for surgery - Our schedulers will call you.  You should hear from our office's scheduling department within 5 working days about the location, date, and time of surgery. We try to make accommodations for patient's preferences in scheduling surgery, but sometimes the OR schedule or the surgeon's schedule prevents [place] from making those accommodations.  If you have not heard from our office [phone]) in 5 working days, call the office and ask for your surgeon's nurse.  If you have other questions about your diagnosis, plan,  or surgery, call the office and ask for your surgeon's nurse.  Pt Education - CSS Breast Biopsy Instructions (FLB): discussed with patient and provided information.   Signed by Stark Klein, MD

## 2015-11-28 NOTE — Interval H&P Note (Signed)
History and Physical Interval Note:  11/28/2015 1:01 PM  Natasha Donovan  has presented today for surgery, with the diagnosis of RIGHT NIPPLE BLOODY DISCHARGE  The various methods of treatment have been discussed with the patient and family. After consideration of risks, benefits and other options for treatment, the patient has consented to  Procedure(s): EXCISION OF RIGHT BREAST MAJOR DUCT (Right) as a surgical intervention .  The patient's history has been reviewed, patient examined, no change in status, stable for surgery.  I have reviewed the patient's chart and labs.  Questions were answered to the patient's satisfaction.     [name]

## 2015-11-28 NOTE — Transfer of Care (Signed)
Immediate Anesthesia Transfer of Care Note  Patient: Natasha Donovan  Procedure(s) Performed: Procedure(s): EXCISION OF RIGHT BREAST MAJOR DUCT (Right)  Patient Location: PACU  Anesthesia Type:General  Level of Consciousness: awake and patient cooperative  Airway & Oxygen Therapy: Patient Spontanous Breathing and Patient connected to face mask oxygen  Post-op Assessment: Report given to RN and Post -op Vital signs reviewed and stable  Post vital signs: Reviewed and stable  Last Vitals:  Vitals:   11/28/15 1110  BP: (!) 155/68  Pulse: 84  Resp: 18  Temp: 36.8 C    Last Pain:  Vitals:   11/28/15 1110  TempSrc: Oral      Patients Stated Pain Goal: 0 ([id number])  Complications: No apparent anesthesia complications

## 2015-11-28 NOTE — Anesthesia Procedure Notes (Signed)
Procedure Name: LMA Insertion Date/Time: 11/28/2015 1:12 PM Performed by: Toula Moos L Pre-anesthesia Checklist: Patient identified, Emergency Drugs available, Suction available, Patient being monitored and Timeout performed Patient Re-evaluated:Patient Re-evaluated prior to inductionOxygen Delivery Method: Circle system utilized Preoxygenation: Pre-oxygenation with 100% oxygen Intubation Type: IV induction Ventilation: Mask ventilation without difficulty LMA: LMA inserted LMA Size: 4.0 Number of attempts: 1 Airway Equipment and Method: Bite block Placement Confirmation: positive ETCO2 Tube secured with: Tape Dental Injury: Teeth and Oropharynx as per pre-operative assessment

## 2015-11-29 ENCOUNTER — Encounter (HOSPITAL_BASED_OUTPATIENT_CLINIC_OR_DEPARTMENT_OTHER): Payer: Self-pay | Admitting: General Surgery

## 2015-12-04 NOTE — Progress Notes (Signed)
Please let patient know path is benign.

## 2017-06-04 ENCOUNTER — Other Ambulatory Visit: Payer: Self-pay | Admitting: Obstetrics and Gynecology

## 2017-06-04 ENCOUNTER — Other Ambulatory Visit (HOSPITAL_COMMUNITY)
Admission: RE | Admit: 2017-06-04 | Discharge: 2017-06-04 | Disposition: A | Discharge status: Home / Self Care | Payer: No Typology Code available for payment source | Source: Ambulatory Visit | Attending: Obstetrics and Gynecology | Admitting: Obstetrics and Gynecology

## 2017-06-04 DIAGNOSIS — Z124 Encounter for screening for malignant neoplasm of cervix: Secondary | ICD-10-CM | POA: Diagnosis not present

## 2017-06-06 LAB — CYTOLOGY - PAP
Diagnosis: NEGATIVE
HPV: NOT DETECTED

## 2017-09-01 ENCOUNTER — Other Ambulatory Visit: Payer: Self-pay | Admitting: Family Medicine

## 2017-09-01 DIAGNOSIS — Z1231 Encounter for screening mammogram for malignant neoplasm of breast: Secondary | ICD-10-CM

## 2017-09-05 ENCOUNTER — Other Ambulatory Visit: Payer: Self-pay | Admitting: Physician Assistant

## 2017-09-05 DIAGNOSIS — N644 Mastodynia: Secondary | ICD-10-CM

## 2017-09-19 ENCOUNTER — Ambulatory Visit
Admission: RE | Admit: 2017-09-19 | Discharge: 2017-09-19 | Disposition: A | Discharge status: Home / Self Care | Payer: No Typology Code available for payment source | Source: Ambulatory Visit | Attending: Physician Assistant | Admitting: Physician Assistant

## 2017-09-19 ENCOUNTER — Other Ambulatory Visit: Payer: Self-pay | Admitting: Physician Assistant

## 2017-09-19 ENCOUNTER — Ambulatory Visit: Payer: PRIVATE HEALTH INSURANCE

## 2017-09-19 DIAGNOSIS — N644 Mastodynia: Secondary | ICD-10-CM

## 2018-09-07 ENCOUNTER — Other Ambulatory Visit: Payer: Self-pay | Admitting: *Deleted

## 2018-09-07 DIAGNOSIS — Z20822 Contact with and (suspected) exposure to covid-19: Secondary | ICD-10-CM

## 2018-09-10 NOTE — Addendum Note (Signed)
Addended by: Brigitte Pulse on: 09/10/2018 03:56 PM   Modules accepted: Orders

## 2019-10-19 ENCOUNTER — Ambulatory Visit: Payer: No Typology Code available for payment source | Attending: Internal Medicine

## 2019-10-19 DIAGNOSIS — Z23 Encounter for immunization: Secondary | ICD-10-CM

## 2019-10-19 NOTE — Progress Notes (Signed)
   Covid-19 Vaccination Clinic  Name:  Natasha Donovan    MRN: [id number] DOB: [birth year]  10/19/2019  [name] was observed post Covid-19 immunization for 15 minutes without incident. She was provided with Vaccine Information Sheet and instruction to access the V-Safe system.   [name] was instructed to call 911 with any severe reactions post vaccine: Marland Kitchen Difficulty breathing  . Swelling of face and throat  . A fast heartbeat  . A bad rash all over body  . Dizziness and weakness   Immunizations Administered    Name Date Dose VIS Date Route   Pfizer COVID-19 Vaccine 10/19/2019  1:23 PM 0.3 mL 05/12/2018 Intramuscular   Manufacturer: [place]   Lot: [id number]   [place]: [id number]

## 2019-11-09 ENCOUNTER — Ambulatory Visit: Payer: No Typology Code available for payment source | Attending: Internal Medicine

## 2019-11-09 DIAGNOSIS — Z23 Encounter for immunization: Secondary | ICD-10-CM

## 2019-11-09 NOTE — Progress Notes (Signed)
   Covid-19 Vaccination Clinic  Name:  Natasha Donovan    MRN: [id number] DOB: 1965/07/11  11/09/2019  Natasha Donovan was observed post Covid-19 immunization for 15 minutes without incident. She was provided with Vaccine Information Sheet and instruction to access the V-Safe system.   Natasha Donovan was instructed to call 911 with any severe reactions post vaccine: Marland Kitchen Difficulty breathing  . Swelling of face and throat  . A fast heartbeat  . A bad rash all over body  . Dizziness and weakness   Immunizations Administered    Name Date Dose VIS Date Route   Pfizer COVID-19 Vaccine 11/09/2019 12:35 PM 0.3 mL 05/12/2018 Intramuscular   Manufacturer: [place]   Lot: [id number]   [place]: [id number]

## 2020-01-06 ENCOUNTER — Other Ambulatory Visit: Payer: Self-pay | Admitting: Family Medicine

## 2020-01-06 DIAGNOSIS — Z1231 Encounter for screening mammogram for malignant neoplasm of breast: Secondary | ICD-10-CM

## 2020-02-02 ENCOUNTER — Other Ambulatory Visit: Payer: Self-pay | Admitting: Family Medicine

## 2020-02-02 DIAGNOSIS — R928 Other abnormal and inconclusive findings on diagnostic imaging of breast: Secondary | ICD-10-CM

## 2020-12-28 ENCOUNTER — Other Ambulatory Visit: Payer: Self-pay | Admitting: Family Medicine

## 2020-12-28 DIAGNOSIS — R928 Other abnormal and inconclusive findings on diagnostic imaging of breast: Secondary | ICD-10-CM

## 2021-01-15 ENCOUNTER — Ambulatory Visit
Admission: RE | Admit: 2021-01-15 | Discharge: 2021-01-15 | Disposition: A | Discharge status: Home / Self Care | Payer: No Typology Code available for payment source | Source: Ambulatory Visit | Attending: Family Medicine | Admitting: Family Medicine

## 2021-01-15 ENCOUNTER — Other Ambulatory Visit: Payer: Self-pay

## 2021-01-15 ENCOUNTER — Ambulatory Visit: Payer: No Typology Code available for payment source

## 2021-01-15 DIAGNOSIS — R928 Other abnormal and inconclusive findings on diagnostic imaging of breast: Secondary | ICD-10-CM

## 2021-09-10 ENCOUNTER — Emergency Department (HOSPITAL_COMMUNITY)
Admission: EM | Admit: 2021-09-10 | Discharge: 2021-09-10 | Disposition: A | Discharge status: Home / Self Care | Payer: No Typology Code available for payment source | Attending: Emergency Medicine | Admitting: Emergency Medicine

## 2021-09-10 ENCOUNTER — Other Ambulatory Visit: Payer: Self-pay

## 2021-09-10 ENCOUNTER — Encounter (HOSPITAL_COMMUNITY): Payer: Self-pay | Admitting: Emergency Medicine

## 2021-09-10 ENCOUNTER — Emergency Department (HOSPITAL_COMMUNITY): Payer: No Typology Code available for payment source

## 2021-09-10 DIAGNOSIS — R110 Nausea: Secondary | ICD-10-CM | POA: Insufficient documentation

## 2021-09-10 DIAGNOSIS — K760 Fatty (change of) liver, not elsewhere classified: Secondary | ICD-10-CM | POA: Diagnosis not present

## 2021-09-10 DIAGNOSIS — R030 Elevated blood-pressure reading, without diagnosis of hypertension: Secondary | ICD-10-CM | POA: Diagnosis not present

## 2021-09-10 DIAGNOSIS — R1031 Right lower quadrant pain: Secondary | ICD-10-CM

## 2021-09-10 DIAGNOSIS — R739 Hyperglycemia, unspecified: Secondary | ICD-10-CM | POA: Insufficient documentation

## 2021-09-10 DIAGNOSIS — Z91040 Latex allergy status: Secondary | ICD-10-CM | POA: Insufficient documentation

## 2021-09-10 LAB — COMPREHENSIVE METABOLIC PANEL
ALT: 26 U/L (ref 0–44)
AST: 22 U/L (ref 15–41)
Albumin: 3.8 g/dL (ref 3.5–5.0)
Alkaline Phosphatase: 63 U/L (ref 38–126)
Anion gap: 11 (ref 5–15)
BUN: 17 mg/dL (ref 6–20)
CO2: 24 mmol/L (ref 22–32)
Calcium: 9.2 mg/dL (ref 8.9–10.3)
Chloride: 102 mmol/L (ref 98–111)
Creatinine, Ser: 1.05 mg/dL — ABNORMAL HIGH (ref 0.44–1.00)
GFR, Estimated: >60 mL/min (ref >60)
Glucose, Bld: 139 mg/dL — ABNORMAL HIGH (ref 70–99)
Potassium: 3.6 mmol/L (ref 3.5–5.1)
Sodium: 137 mmol/L (ref 135–145)
Total Bilirubin: 0.7 mg/dL (ref 0.3–1.2)
Total Protein: 7.6 g/dL (ref 6.5–8.1)

## 2021-09-10 LAB — URINALYSIS, ROUTINE W REFLEX MICROSCOPIC
Bilirubin Urine: NEGATIVE
Glucose, UA: NEGATIVE mg/dL
Hgb urine dipstick: NEGATIVE
Ketones, ur: NEGATIVE mg/dL
Leukocytes,Ua: NEGATIVE
Nitrite: NEGATIVE
Protein, ur: NEGATIVE mg/dL
Specific Gravity, Urine: 1.014 (ref 1.005–1.030)
pH: 5 (ref 5.0–8.0)

## 2021-09-10 LAB — CBC
HCT: 39.9 % (ref 36.0–46.0)
Hemoglobin: 12.6 g/dL (ref 12.0–15.0)
MCH: 28.8 pg (ref 26.0–34.0)
MCHC: 31.6 g/dL (ref 30.0–36.0)
MCV: 91.1 fL (ref 80.0–100.0)
Platelets: 242 10*3/uL (ref 150–400)
RBC: 4.38 MIL/uL (ref 3.87–5.11)
RDW: 14.4 % (ref 11.5–15.5)
WBC: 9.2 10*3/uL (ref 4.0–10.5)
nRBC: 0 % (ref 0.0–0.2)

## 2021-09-10 LAB — LIPASE, BLOOD: Lipase: 27 U/L (ref 11–51)

## 2021-09-10 MED ORDER — IOHEXOL 300 MG/ML  SOLN
100.0000 mL | Freq: Once | INTRAMUSCULAR | Status: AC | PRN
  Administered 2021-09-10: 100 mL via INTRAVENOUS

## 2021-10-31 NOTE — Progress Notes (Unsigned)
New Patient Note  RE: Natasha Donovan MRN: [id number] DOB: 06/29/1965 Date of Office Visit: 11/01/2021  Consult requested by: Carol Ada, MD Primary care provider: Carol Ada, MD  Chief Complaint: Rash (States she broke out on her fingers and went to dermatology and was started on [place]. States she went back after the rash came back while on Dupixent. Dermatologist sent her back as now she has a break out on her fingers. Dermatologist  suggest maybe atopic dermatitis or food allergy.)  History of Present Illness: I had the pleasure of seeing Natasha Donovan for initial evaluation at the [facility] on 11/01/2021. She is a 56 y.o. female, who is referred here by Carol Ada, MD for the evaluation of rash.  Patient had eczema all her life which was pretty well controlled but starting in her 83s it flared up.  Mainly occurs on her hands and lower extremities. Describes them as dry, cracked skin, itchy. No ecchymosis upon resolution. Associated symptoms include: none.  Frequency of episodes: usually flares during the weather changes. Suspected triggers are unknown. Denies any fevers, chills, changes in medications, foods, personal care products or recent infections. She has tried the following therapies: prednisone, topical clobetasol with some benefit. Systemic steroids prednisone.   Previous work up includes: saw dermatology and has been on [place] on 2 different occasions. Patient had skin biopsy in the past many years ago.  Joint pain resolved after stopping Dupixent.  Patient is up to date with the following cancer screening tests: physical exam, mammogram, colonoscopy - next week, pap smears.  Assessment and Plan: Natasha Donovan is a 56 y.o. female with: Rash History of eczema but noted a flare in her 90s and now having issues on her hands and lower extremities.  Saw dermatology and was started on Dupixent on 2 different occasions which caused joint pains the  last time.  Tried topical steroids and prednisone with some benefit.  Concerned about allergic triggers. Today's skin prick testing showed: Positive to grass, ragweed, trees and cat.  Negative to common foods.  Discussed with patient that I'm not sure how much of the above allergens are contributing to her current skin issues.  See below for proper skin care - stressed importance of moisturizing daily as she had very dry skin today.  Use Eucrisa (crisaborole) 2% ointment twice a day on mild rash flares on the face and body. This is a non-steroid ointment. Samples given.  Try epicerum daily - use as a moisturizer twice a day. Sample given.  Follow up with your dermatologist. Consider patch testing next. Get bloodwork to rule out other etiologies.   Dyshidrotic eczema See assessment and plan as above.  Other allergic rhinitis Denies any significant rhinoconjunctivitis symptoms. Today's skin prick testing showed: Positive to grass, ragweed, trees and cat.  See below for environmental control measures. No antihistamines recommended due to dry skin and denying any daily symptoms.  Elevated blood pressure reading Blood pressure reading was 158/70 in the office. Patient states that it is usually higher in the 170s. Currently not on any antihypertensive medications. Stressed importance multiple times with patient to see her PCP regarding this as her blood pressure was elevated.   Return in about 3 months (around 02/01/2022).  Meds ordered this encounter  Medications   Dermatological Products, Misc. [facility]) lotion    Sig: Apply twice a day to the body as a moisturizer    Dispense:  225 g    Refill:  3  [phone]   Crisaborole (EUCRISA) 2 % OINT    Sig: Apply 1 Application topically 2 (two) times daily as needed (mild rash).    Dispense:  60 g    Refill:  3   Lab Orders         Alpha-Gal Panel         ANA w/Reflex         CBC with Differential/Platelet         Comprehensive  metabolic panel         C-reactive protein         Sedimentation rate         Tryptase         Thyroid Cascade Profile      Other allergy screening: Asthma: no Rhino conjunctivitis: no Food allergy: no Medication allergy: no Hymenoptera allergy: no Urticaria: no History of recurrent infections suggestive of immunodeficency: no  Diagnostics: Skin Testing: Environmental allergy panel and select foods. Positive to grass, ragweed, trees and cat.  Negative to common foods.  Results discussed with patient/family.  Airborne Adult Perc - 11/01/21 1420     Time Antigen Placed 1420    Allergen Manufacturer Lavella Hammock    Location Back    Number of Test 59    1. Control-Buffer 50% Glycerol Negative    2. Control-Histamine 1 mg/ml 2+    3. Albumin saline Negative    4. Arpelar Negative    5. [place] Negative    6. Johnson Negative    7. Kentucky Blue 3+    8. Meadow Fescue Negative    9. Perennial Rye 2+    10. Sweet Vernal Negative    11. Timothy 2+    12. Cocklebur Negative    13. Burweed Marshelder 2+    14. Ragweed, short Negative    15. Ragweed, Giant Negative    16. Plantain,  English Negative    17. Lamb's Quarters Negative    18. Sheep Sorrell Negative    19. Rough Pigweed Negative    20. Marsh Elder, Rough Negative    21. Mugwort, Common Negative    22. Ash mix Negative    23. Birch mix Negative    24. Beech American Negative    25. Box, Elder Negative    26. Cedar, red Negative    27. Cottonwood, [place] Negative    28. Elm mix Negative    29. Hickory Negative    30. Maple mix Negative    31. Oak, [place] mix 2+    32. Pecan Pollen Negative    33. Pine mix Negative    34. Sycamore Eastern Negative    35. [place], Black Pollen Negative    36. Alternaria alternata Negative    37. Cladosporium Herbarum Negative    38. Aspergillus mix Negative    39. Penicillium mix Negative    40. Bipolaris sorokiniana (Helminthosporium) Negative    41. Drechslera spicifera  (Curvularia) Negative    42. Mucor plumbeus Negative    43. Fusarium moniliforme Negative    44. Aureobasidium pullulans (pullulara) Negative    45. Rhizopus oryzae Negative    46. Botrytis cinera Negative    47. Epicoccum nigrum Negative    48. Phoma betae Negative    49. Candida Albicans Negative    50. Trichophyton mentagrophytes Negative    51. Mite, D Farinae  5,000 AU/ml Negative    52. Mite, D Pteronyssinus  5,000 AU/ml Negative    53. Cat Hair 10,000 BAU/ml  3+    54.  Dog Epithelia Negative    55. Mixed Feathers Negative    56. Horse Epithelia Negative    57. Cockroach, German Negative    58. Mouse Negative    59. Tobacco Leaf Negative             Food Perc - 11/01/21 1420       Test Information   Time Antigen Placed 1421    Allergen Manufacturer Lavella Hammock    Number of allergen test 10      Food   1. Peanut Negative    2. Soybean food Negative    3. Wheat, whole Negative    4. Sesame Negative    5. Milk, cow Negative    6. Egg White, chicken Negative    7. Casein Negative    8. Shellfish mix Negative    9. Fish mix Negative    10. Cashew Negative             Past Medical History: Patient Active Problem List   Diagnosis Date Noted   Dyshidrotic eczema 11/01/2021   Rash 11/01/2021   Other allergic rhinitis 11/01/2021   Elevated blood pressure reading 11/01/2021   Past Medical History:  Diagnosis Date   Eczema    Past Surgical History: Past Surgical History:  Procedure Laterality Date   BREAST BIOPSY Right    BREAST DUCTAL SYSTEM EXCISION Right 11/28/2015   Procedure: EXCISION OF RIGHT BREAST MAJOR DUCT;  Surgeon: Stark Klein, MD;  Location: [facility];  Service: General;  Laterality: Right;   BREAST EXCISIONAL BIOPSY Right    NO PAST SURGERIES     Medication List:  Current Outpatient Medications  Medication Sig Dispense Refill   BIOTIN PO Take 1 capsule by mouth.     Cholecalciferol (VITAMIN D-3) 1000 UNITS CAPS Take 1,000  Units by mouth daily.     Crisaborole (EUCRISA) 2 % OINT Apply 1 Application topically 2 (two) times daily as needed (mild rash). 60 g 3   Dermatological Products, Misc. [facility]) lotion Apply twice a day to the body as a moisturizer 225 g 3   Multiple Vitamin (MULTIVITAMIN) tablet Take 1 tablet by mouth daily.     Probiotic Product (PROBIOTIC DAILY PO) Take 1 capsule by mouth daily.     vitamin B-12 (CYANOCOBALAMIN) 100 MCG tablet Take 100 mcg by mouth daily.     No current facility-administered medications for this visit.   Allergies: Allergies  Allergen Reactions   Latex Swelling   Social History: Social History   Socioeconomic History   Marital status: Single    Spouse name: Not on file   Number of children: Not on file   Years of education: Not on file   Highest education level: Not on file  Occupational History   Not on file  Tobacco Use   Smoking status: Never   Smokeless tobacco: Never  Substance and Sexual Activity   Alcohol use: Yes    Comment: occasional   Drug use: No   Sexual activity: Not on file  Other Topics Concern   Not on file  Social History Narrative   Not on file   Social Determinants of Health   Financial Resource Strain: Not on file  Food Insecurity: Not on file  Transportation Needs: Not on file  Physical Activity: Not on file  Stress: Not on file  Social Connections: Not on file   Lives in a 57 year old house. Smoking: denies Occupation:  legal assistant  Environmental History: Water Damage/mildew in the house: no Carpet in the family room: no Carpet in the bedroom: no Heating: electric Cooling: central Pet: yes 1 cat outdoors  Family History: History reviewed. No pertinent family history. Problem                               Relation Asthma                                   Sister  Eczema                                Sister  Food allergy                          no Allergic rhino conjunctivitis     no  Review of Systems   Constitutional:  Negative for appetite change, chills, fever and unexpected weight change.  HENT:  Negative for congestion and rhinorrhea.   Eyes:  Negative for itching.  Respiratory:  Negative for cough, chest tightness, shortness of breath and wheezing.   Cardiovascular:  Negative for chest pain.  Gastrointestinal:  Negative for abdominal pain.  Genitourinary:  Negative for difficulty urinating.  Skin:  Positive for rash.  Allergic/Immunologic: Positive for environmental allergies. Negative for food allergies.  Neurological:  Negative for headaches.    Objective: BP (!) 158/70   Pulse 94   Temp 98.7 F (37.1 C) (Temporal)   Resp 18   Ht 5' 4.25" (1.632 m)   Wt 224 lb 8 oz (101.8 kg)   LMP 09/30/2012   SpO2 99%   BMI 38.23 kg/m  Body mass index is 38.23 kg/m. Physical Exam Vitals and nursing note reviewed.  Constitutional:      Appearance: Normal appearance. She is well-developed. She is obese.  HENT:     Head: Normocephalic and atraumatic.     Right Ear: Tympanic membrane and external ear normal.     Left Ear: Tympanic membrane and external ear normal.     Nose: Nose normal.     Mouth/Throat:     Mouth: Mucous membranes are moist.     Pharynx: Oropharynx is clear.  Eyes:     Conjunctiva/sclera: Conjunctivae normal.  Cardiovascular:     Rate and Rhythm: Normal rate and regular rhythm.     Heart sounds: Normal heart sounds. No murmur heard.    No friction rub. No gallop.  Pulmonary:     Effort: Pulmonary effort is normal.     Breath sounds: Normal breath sounds. No wheezing, rhonchi or rales.  Musculoskeletal:     Cervical back: Neck supple.  Skin:    General: Skin is warm and dry.     Findings: Rash present.     Comments: Hyperpigmented skin changes on posterior calves. Extremely dry skin.  Neurological:     Mental Status: She is alert and oriented to person, place, and time.  Psychiatric:        Behavior: Behavior normal.   The plan was reviewed with the  patient/family, and all questions/concerned were addressed.  It was my pleasure to see Chantel today and participate in her care. Please feel free to contact me with any questions or concerns.  Sincerely,  Rexene Alberts, DO  Allergy & Immunology  [facility] office: [place] office: [phone]

## 2021-11-01 ENCOUNTER — Encounter: Payer: Self-pay | Admitting: Allergy

## 2021-11-01 ENCOUNTER — Ambulatory Visit (INDEPENDENT_AMBULATORY_CARE_PROVIDER_SITE_OTHER): Payer: No Typology Code available for payment source | Admitting: Allergy

## 2021-11-01 VITALS — BP 158/70 | HR 94 | Temp 98.7°F | Resp 18 | Ht 64.25 in | Wt 224.5 lb

## 2021-11-01 DIAGNOSIS — R21 Rash and other nonspecific skin eruption: Secondary | ICD-10-CM

## 2021-11-01 DIAGNOSIS — L301 Dyshidrosis [pompholyx]: Secondary | ICD-10-CM

## 2021-11-01 DIAGNOSIS — L299 Pruritus, unspecified: Secondary | ICD-10-CM

## 2021-11-01 DIAGNOSIS — J3089 Other allergic rhinitis: Secondary | ICD-10-CM | POA: Diagnosis not present

## 2021-11-01 DIAGNOSIS — L2089 Other atopic dermatitis: Secondary | ICD-10-CM

## 2021-11-01 DIAGNOSIS — R030 Elevated blood-pressure reading, without diagnosis of hypertension: Secondary | ICD-10-CM | POA: Insufficient documentation

## 2021-11-01 MED ORDER — EUCRISA 2 % EX OINT: 1.0000 | TOPICAL_OINTMENT | Freq: Two times a day (BID) | CUTANEOUS | 3 refills | Status: DC | PRN

## 2021-11-01 MED ORDER — EPICERAM EX EMUL: CUTANEOUS | 3 refills | Status: AC

## 2021-11-01 NOTE — Patient Instructions (Addendum)
Today's skin testing showed: Positive to grass, ragweed, trees and cat.  Negative to common foods.  Results given.  Skin See below for proper skin care. Use Eucrisa (crisaborole) 2% ointment twice a day on mild rash flares on the face and body. This is a non-steroid ointment. Samples given.  If it burns, place the medication in the refrigerator.  Apply a thin layer of moisturizer and then apply the Eucrisa on top of it. Try epicerum daily - use as a moisturizer twice a day. Sample given.  Follow up with your dermatologist. Consider patch testing next.  Get bloodwork We are ordering labs, so please allow 1-2 weeks for the results to come back. With the newly implemented Cures Act, the labs might be visible to you at the same time that they become visible to me. However, I will not address the results until all of the results are back, so please be patient.  In the meantime, continue recommendations in your patient instructions, including avoidance measures (if applicable), until you hear from me.  Environmental allergies See below for environmental control measures. I don't think the above allergens are causing your skin issues though.  Follow up in 3 months or sooner if needed.   Please follow up with your PCP regarding your blood pressure.   Skin care recommendations  Bath time: Always use lukewarm water. AVOID very hot or cold water. Keep bathing time to 5-10 minutes. Do NOT use bubble bath. Use a mild soap and use just enough to wash the dirty areas. Do NOT scrub skin vigorously.  After bathing, pat dry your skin with a towel. Do NOT rub or scrub the skin.  Moisturizers and prescriptions:  ALWAYS apply moisturizers immediately after bathing (within 3 minutes). This helps to lock-in moisture. Use the moisturizer several times a day over the whole body. Good summer moisturizers include: Aveeno, CeraVe, Cetaphil. Good winter moisturizers include: Aquaphor, Vaseline, Cerave,  Cetaphil, Eucerin, Vanicream. When using moisturizers along with medications, the moisturizer should be applied about one hour after applying the medication to prevent diluting effect of the medication or moisturize around where you applied the medications. When not using medications, the moisturizer can be continued twice daily as maintenance.  Laundry and clothing: Avoid laundry products with added color or perfumes. Use unscented hypo-allergenic laundry products such as Tide free, Cheer free & gentle, and All free and clear.  If the skin still seems dry or sensitive, you can try double-rinsing the clothes. Avoid tight or scratchy clothing such as wool. Do not use fabric softeners or dyer sheets.   Reducing Pollen Exposure Pollen seasons: trees (spring), grass (summer) and ragweed/weeds (fall). Keep windows closed in your home and car to lower pollen exposure.  Install air conditioning in the bedroom and throughout the house if possible.  Avoid going out in dry windy days - especially early morning. Pollen counts are highest between 5 - 10 AM and on dry, hot and windy days.  Save outside activities for late afternoon or after a heavy rain, when pollen levels are lower.  Avoid mowing of grass if you have grass pollen allergy. Be aware that pollen can also be transported indoors on people and pets.  Dry your clothes in an automatic dryer rather than hanging them outside where they might collect pollen.  Rinse hair and eyes before bedtime.  Pet Allergen Avoidance: Contrary to popular opinion, there are no "hypoallergenic" breeds of dogs or cats. That is because people are not allergic to an  animal's hair, but to an allergen found in the animal's saliva, dander (dead skin flakes) or urine. Pet allergy symptoms typically occur within minutes. For some people, symptoms can build up and become most severe 8 to 12 hours after contact with the animal. People with severe allergies can experience  reactions in public places if dander has been transported on the pet owners' clothing. Keeping an animal outdoors is only a partial solution, since homes with pets in the yard still have higher concentrations of animal allergens. Before getting a pet, ask your allergist to determine if you are allergic to animals. If your pet is already considered part of your family, try to minimize contact and keep the pet out of the bedroom and other rooms where you spend a great deal of time. As with dust mites, vacuum carpets often or replace carpet with a hardwood floor, tile or linoleum. High-efficiency particulate air (HEPA) cleaners can reduce allergen levels over time. While dander and saliva are the source of cat and dog allergens, urine is the source of allergens from rabbits, hamsters, mice and [place] pigs; so ask a non-allergic family member to clean the animal's cage. If you have a pet allergy, talk to your allergist about the potential for allergy immunotherapy (allergy shots). This strategy can often provide long-term relief.

## 2021-11-01 NOTE — Assessment & Plan Note (Signed)
Denies any significant rhinoconjunctivitis symptoms.  Today's skin prick testing showed: Positive to grass, ragweed, trees and cat.  . See below for environmental control measures. . No antihistamines recommended due to dry skin and denying any daily symptoms.

## 2021-11-01 NOTE — Assessment & Plan Note (Signed)
.   See assessment and plan as above. 

## 2021-11-01 NOTE — Assessment & Plan Note (Signed)
History of eczema but noted a flare in her 108s and now having issues on her hands and lower extremities.  Saw dermatology and was started on Dupixent on 2 different occasions which caused joint pains the last time.  Tried topical steroids and prednisone with some benefit.  Concerned about allergic triggers.  Today's skin prick testing showed: Positive to grass, ragweed, trees and cat.  Negative to common foods.  o Discussed with patient that I'm not sure how much of the above allergens are contributing to her current skin issues.  . See below for proper skin care - stressed importance of moisturizing daily as she had very dry skin today.  . Use Eucrisa (crisaborole) 2% ointment twice a day on mild rash flares on the face and body. This is a non-steroid ointment. Samples given.  . Try epicerum daily - use as a moisturizer twice a day. Sample given.  . Follow up with your dermatologist. o Consider patch testing next. . Get bloodwork to rule out other etiologies.

## 2021-11-01 NOTE — Assessment & Plan Note (Signed)
Blood pressure reading was 158/70 in the office. Patient states that it is usually higher in the 170s. Currently not on any antihypertensive medications.  Stressed importance multiple times with patient to see her PCP regarding this as her blood pressure was elevated.

## 2021-11-02 ENCOUNTER — Telehealth: Payer: Self-pay

## 2021-11-02 NOTE — Telephone Encounter (Signed)
Pa submitted thru cover my meds for eucrisa kay [id number]

## 2021-12-14 ENCOUNTER — Ambulatory Visit: Payer: Self-pay | Admitting: Allergy

## 2022-02-03 NOTE — Progress Notes (Deleted)
Follow Up Note  RE: Natasha Donovan MRN: [id number] DOB: 03/21/1965 Date of Office Visit: 02/04/2022  Referring provider: Carol Ada, MD Primary care provider: Carol Ada, MD  Chief Complaint: No chief complaint on file.  History of Present Illness: I had the pleasure of seeing Natasha Donovan for a follow up visit at the [facility] on 02/03/2022. She is a 56 y.o. female, who is being followed for rash, dyshidrotic eczema, allergic rhinitis. Her previous allergy office visit was on 11/01/2021 with Dr. Maudie Mercury. Today is a regular follow up visit.  Did she get blood work drawn?  Rash History of eczema but noted a flare in her 59s and now having issues on her hands and lower extremities.  Saw dermatology and was started on Dupixent on 2 different occasions which caused joint pains the last time.  Tried topical steroids and prednisone with some benefit.  Concerned about allergic triggers. Today's skin prick testing showed: Positive to grass, ragweed, trees and cat.  Negative to common foods.  Discussed with patient that I'm not sure how much of the above allergens are contributing to her current skin issues.  See below for proper skin care - stressed importance of moisturizing daily as she had very dry skin today.  Use Eucrisa (crisaborole) 2% ointment twice a day on mild rash flares on the face and body. This is a non-steroid ointment. Samples given.  Try epicerum daily - use as a moisturizer twice a day. Sample given.  Follow up with your dermatologist. Consider patch testing next. Get bloodwork to rule out other etiologies.    Dyshidrotic eczema See assessment and plan as above.   Other allergic rhinitis Denies any significant rhinoconjunctivitis symptoms. Today's skin prick testing showed: Positive to grass, ragweed, trees and cat.  See below for environmental control measures. No antihistamines recommended due to dry skin and denying any daily symptoms.    Elevated blood pressure reading Blood pressure reading was 158/70 in the office. Patient states that it is usually higher in the 170s. Currently not on any antihypertensive medications. Stressed importance multiple times with patient to see her PCP regarding this as her blood pressure was elevated.    Return in about 3 months (around 02/01/2022).  Assessment and Plan: Dyamon is a 56 y.o. female with: No problem-specific Assessment & Plan notes found for this encounter.  No follow-ups on file.  No orders of the defined types were placed in this encounter.  Lab Orders  No laboratory test(s) ordered today    Diagnostics: Spirometry:  Tracings reviewed. Her effort: {Blank single:19197::"Good reproducible efforts.","It was hard to get consistent efforts and there is a question as to whether this reflects a maximal maneuver.","Poor effort, data can not be interpreted."} FVC: ***L FEV1: ***L, ***% predicted FEV1/FVC ratio: ***% Interpretation: {Blank single:19197::"Spirometry consistent with mild obstructive disease","Spirometry consistent with moderate obstructive disease","Spirometry consistent with severe obstructive disease","Spirometry consistent with possible restrictive disease","Spirometry consistent with mixed obstructive and restrictive disease","Spirometry uninterpretable due to technique","Spirometry consistent with normal pattern","No overt abnormalities noted given today's efforts"}.  Please see scanned spirometry results for details.  Skin Testing: {Blank single:19197::"Select foods","Environmental allergy panel","Environmental allergy panel and select foods","Food allergy panel","None","Deferred due to recent antihistamines use"}. *** Results discussed with patient/family.   Medication List:  Current Outpatient Medications  Medication Sig Dispense Refill   BIOTIN PO Take 1 capsule by mouth.     Cholecalciferol (VITAMIN D-3) 1000 UNITS CAPS Take 1,000 Units by mouth  daily.  Crisaborole (EUCRISA) 2 % OINT Apply 1 Application topically 2 (two) times daily as needed (mild rash). 60 g 3   Dermatological Products, Misc. [facility]) lotion Apply twice a day to the body as a moisturizer 225 g 3   Multiple Vitamin (MULTIVITAMIN) tablet Take 1 tablet by mouth daily.     Probiotic Product (PROBIOTIC DAILY PO) Take 1 capsule by mouth daily.     vitamin B-12 (CYANOCOBALAMIN) 100 MCG tablet Take 100 mcg by mouth daily.     No current facility-administered medications for this visit.   Allergies: Allergies  Allergen Reactions   Latex Swelling   I reviewed her past medical history, social history, family history, and environmental history and no significant changes have been reported from her previous visit.  Review of Systems  Constitutional:  Negative for appetite change, chills, fever and unexpected weight change.  HENT:  Negative for congestion and rhinorrhea.   Eyes:  Negative for itching.  Respiratory:  Negative for cough, chest tightness, shortness of breath and wheezing.   Cardiovascular:  Negative for chest pain.  Gastrointestinal:  Negative for abdominal pain.  Genitourinary:  Negative for difficulty urinating.  Skin:  Positive for rash.  Allergic/Immunologic: Positive for environmental allergies. Negative for food allergies.  Neurological:  Negative for headaches.    Objective: LMP 09/30/2012  There is no height or weight on file to calculate BMI. Physical Exam Vitals and nursing note reviewed.  Constitutional:      Appearance: Normal appearance. She is well-developed. She is obese.  HENT:     Head: Normocephalic and atraumatic.     Right Ear: Tympanic membrane and external ear normal.     Left Ear: Tympanic membrane and external ear normal.     Nose: Nose normal.     Mouth/Throat:     Mouth: Mucous membranes are moist.     Pharynx: Oropharynx is clear.  Eyes:     Conjunctiva/sclera: Conjunctivae normal.  Cardiovascular:     Rate and  Rhythm: Normal rate and regular rhythm.     Heart sounds: Normal heart sounds. No murmur heard.    No friction rub. No gallop.  Pulmonary:     Effort: Pulmonary effort is normal.     Breath sounds: Normal breath sounds. No wheezing, rhonchi or rales.  Musculoskeletal:     Cervical back: Neck supple.  Skin:    General: Skin is warm and dry.     Findings: Rash present.     Comments: Hyperpigmented skin changes on posterior calves. Extremely dry skin.  Neurological:     Mental Status: She is alert and oriented to person, place, and time.  Psychiatric:        Behavior: Behavior normal.    Previous notes and tests were reviewed. The plan was reviewed with the patient/family, and all questions/concerned were addressed.  It was my pleasure to see Ileanna today and participate in her care. Please feel free to contact me with any questions or concerns.  Sincerely,  Rexene Alberts, DO Allergy & Immunology  [facility] office: [place] office: [phone]

## 2022-02-04 ENCOUNTER — Ambulatory Visit: Payer: No Typology Code available for payment source | Admitting: Allergy

## 2022-02-04 DIAGNOSIS — J302 Other seasonal allergic rhinitis: Secondary | ICD-10-CM

## 2022-02-04 DIAGNOSIS — L301 Dyshidrosis [pompholyx]: Secondary | ICD-10-CM

## 2022-06-13 ENCOUNTER — Other Ambulatory Visit: Payer: Self-pay | Admitting: Obstetrics and Gynecology

## 2022-06-13 ENCOUNTER — Other Ambulatory Visit (HOSPITAL_COMMUNITY)
Admission: RE | Admit: 2022-06-13 | Discharge: 2022-06-13 | Disposition: A | Discharge status: Home / Self Care | Payer: No Typology Code available for payment source | Source: Ambulatory Visit | Attending: Obstetrics and Gynecology | Admitting: Obstetrics and Gynecology

## 2022-06-13 DIAGNOSIS — Z01419 Encounter for gynecological examination (general) (routine) without abnormal findings: Secondary | ICD-10-CM | POA: Insufficient documentation

## 2022-06-17 LAB — CYTOLOGY - PAP
Comment: NEGATIVE
Diagnosis: NEGATIVE
High risk HPV: NEGATIVE

## 2022-07-01 ENCOUNTER — Other Ambulatory Visit: Payer: Self-pay | Admitting: Family Medicine

## 2022-07-01 DIAGNOSIS — Z1231 Encounter for screening mammogram for malignant neoplasm of breast: Secondary | ICD-10-CM

## 2022-07-04 ENCOUNTER — Ambulatory Visit
Admission: RE | Admit: 2022-07-04 | Discharge: 2022-07-04 | Disposition: A | Discharge status: Home / Self Care | Payer: No Typology Code available for payment source | Source: Ambulatory Visit

## 2022-07-04 DIAGNOSIS — Z1231 Encounter for screening mammogram for malignant neoplasm of breast: Secondary | ICD-10-CM

## 2022-07-10 ENCOUNTER — Encounter: Payer: Self-pay | Admitting: Dermatology

## 2022-07-10 ENCOUNTER — Ambulatory Visit: Payer: No Typology Code available for payment source | Admitting: Dermatology

## 2022-07-10 VITALS — BP 150/89

## 2022-07-10 DIAGNOSIS — D225 Melanocytic nevi of trunk: Secondary | ICD-10-CM

## 2022-07-10 DIAGNOSIS — L821 Other seborrheic keratosis: Secondary | ICD-10-CM

## 2022-07-10 DIAGNOSIS — D1801 Hemangioma of skin and subcutaneous tissue: Secondary | ICD-10-CM | POA: Diagnosis not present

## 2022-07-10 DIAGNOSIS — L2089 Other atopic dermatitis: Secondary | ICD-10-CM

## 2022-07-10 DIAGNOSIS — L814 Other melanin hyperpigmentation: Secondary | ICD-10-CM

## 2022-07-10 DIAGNOSIS — Z1283 Encounter for screening for malignant neoplasm of skin: Secondary | ICD-10-CM | POA: Diagnosis not present

## 2022-07-10 MED ORDER — OPZELURA 1.5 % EX CREA: 1.0000 | TOPICAL_CREAM | Freq: Two times a day (BID) | CUTANEOUS | 2 refills | Status: DC

## 2022-07-10 NOTE — Progress Notes (Signed)
   New Patient Visit   Subjective  Natasha Donovan is a 57 y.o. female who presents for the following: Skin Cancer Screening and Full Body Skin Exam. No personal or family history of skin cancer.   She has a history of Eczema and wants a 2nd opinion. She sees Dr. Elmon Else. Meds are Epicerum, Hydrocortisone 2.5% cream, Sarna lotion, Basis soap, Aquaphor. Past treatment with Dupixent --> stopped bc it "cleared" but then it returned.  She restarted but then developed "cramping" in lower leg muscles  The patient presents for Total-Body Skin Exam (TBSE) for skin cancer screening and mole check. The patient has spots, moles and lesions to be evaluated, some may be new or changing and the patient has concerns that these could be cancer.    The following portions of the chart were reviewed this encounter and updated as appropriate: medications, allergies, medical history  Review of Systems:  No other skin or systemic complaints except as noted in HPI or Assessment and Plan.  Objective  Well appearing patient in no apparent distress; mood and affect are within normal limits.  A full examination was performed including scalp, head, eyes, ears, nose, lips, neck, chest, axillae, abdomen, back, buttocks, bilateral upper extremities, bilateral lower extremities, hands, feet, fingers, toes, fingernails, and toenails. All findings within normal limits unless otherwise noted below.   Relevant physical exam findings are noted in the Assessment and Plan.    Assessment & Plan   SEBORRHEIC KERATOSES, HEMANGIOMAS - Benign normal skin lesions - Benign-appearing - Call for any changes  MELANOCYTIC NEVI - Tan-brown and/or pink-flesh-colored symmetric macules and papules - Benign appearing on exam today - Observation - Call clinic for new or changing moles - Recommend daily use of broad spectrum spf 30+ sunscreen to sun-exposed areas.     ATOPIC DERMATITIS Exam: Scaly violaceous papules coalescing  to plaques, BSA: 70%, IGA: 3  Current Status: Flared  Atopic dermatitis (eczema) is a chronic, relapsing, pruritic condition that can significantly affect quality of life. It is often associated with allergic rhinitis and/or asthma and can require treatment with topical medications, phototherapy, or in severe cases biologic injectable medication (Dupixent; Adbry) or Oral JAK inhibitors.  Treatment Plan: -Pt has been on an off Dupixent.  It worked well and then she stopped after a few months, she restarted but then developed cramping in her legs and the skin didn't celar as welll as the first time so she stopped again. -She has been in discussions with her other dermatologist Dr Huel Cote about starting Rinvoq however the side effect profile was concerning.  Pt is also strongly considering starting Ozempic.  I encouraged her to try this since it will most likely resolve the comorbidities that are contributing factors for the SE of Rinvoq (cardiovascular and thrombotic events)  New Rx of Opzelura sent to [facility] --> Apply to areas of body BID x 2 weeks then daily  Recommend gentle skin care.  SKIN CANCER SCREENING PERFORMED TODAY.      Return in about 6 weeks (around 08/21/2022) for Atopic Dermatitis Follow Up.  Jaclynn Guarneri, CMA, am acting as scribe for Langston Reusing, MD.   Documentation: I have reviewed the above documentation for accuracy and completeness, and I agree with the above.  Langston Reusing, MD

## 2022-07-10 NOTE — Patient Instructions (Signed)
    Due to recent changes in healthcare laws, you may see results of your pathology and/or laboratory studies on MyChart before the doctors have had a chance to review them. We understand that in some cases there may be results that are confusing or concerning to you. Please understand that not all results are received at the same time and often the doctors may need to interpret multiple results in order to provide you with the best plan of care or course of treatment. Therefore, we ask that you please give us 2 business days to thoroughly review all your results before contacting the office for clarification. Should we see a critical lab result, you will be contacted sooner.   If You Need Anything After Your Visit  If you have any questions or concerns for your doctor, please call our main line at [phone] If no one answers, please leave a voicemail as directed and we will return your call as soon as possible. Messages left after 4 pm will be answered the following business day.   You may also send us a message via MyChart. We typically respond to MyChart messages within 1-2 business days.  For prescription refills, please ask your pharmacy to contact our office. Our fax number is [phone].  If you have an urgent issue when the clinic is closed that cannot wait until the next business day, you can page your doctor at the number below.    Please note that while we do our best to be available for urgent issues outside of office hours, we are not available 24/7.   If you have an urgent issue and are unable to reach us, you may choose to seek medical care at your doctor's office, retail clinic, urgent care center, or emergency room.  If you have a medical emergency, please immediately call 911 or go to the emergency department. In the event of inclement weather, please call our main line at [phone] for an update on the status of any delays or closures.  Dermatology Medication  Tips: Please keep the boxes that topical medications come in in order to help keep track of the instructions about where and how to use these. Pharmacies typically print the medication instructions only on the boxes and not directly on the medication tubes.   If your medication is too expensive, please contact our office at [phone] or send us a message through MyChart.   We are unable to tell what your co-pay for medications will be in advance as this is different depending on your insurance coverage. However, we may be able to find a substitute medication at lower cost or fill out paperwork to get insurance to cover a needed medication.   If a prior authorization is required to get your medication covered by your insurance company, please allow us 1-2 business days to complete this process.  Drug prices often vary depending on where the prescription is filled and some pharmacies may offer cheaper prices.  The website www.goodrx.com contains coupons for medications through different pharmacies. The prices here do not account for what the cost may be with help from insurance (it may be cheaper with your insurance), but the website can give you the price if you did not use any insurance.  - You can print the associated coupon and take it with your prescription to the pharmacy.  - You may also stop by our office during regular business hours and pick up a GoodRx coupon card.  -   If you need your prescription sent electronically to a different pharmacy, notify our office through [facility] MyChart or by phone at [phone]    Skin Education :   I counseled the patient regarding the following: Sun screen (SPF 30 or greater) should be applied during peak UV exposure (between 10am and 2pm) and reapplied after exercise or swimming.  The ABCDEs of melanoma were reviewed with the patient, and the importance of monthly self-examination of moles was emphasized. Should any moles change in shape or  color, or itch, bleed or burn, pt will contact our office for evaluation sooner then their interval appointment.  Plan: Sunscreen Recommendations I recommended a broad spectrum sunscreen with a SPF of 30 or higher. I explained that SPF 30 sunscreens block approximately 97 percent of the sun's harmful rays. Sunscreens should be applied at least 15 minutes prior to expected sun exposure and then every 2 hours after that as long as sun exposure continues. If swimming or exercising sunscreen should be reapplied every 45 minutes to an hour after getting wet or sweating. One ounce, or the equivalent of a shot glass full of sunscreen, is adequate to protect the skin not covered by a bathing suit. I also recommended a lip balm with a sunscreen as well. Sun protective clothing can be used in lieu of sunscreen but must be worn the entire time you are exposed to the sun's rays.  

## 2022-07-16 ENCOUNTER — Telehealth: Payer: Self-pay

## 2022-07-16 ENCOUNTER — Other Ambulatory Visit: Payer: Self-pay

## 2022-07-16 MED ORDER — PIMECROLIMUS 1 % EX CREA: TOPICAL_CREAM | Freq: Two times a day (BID) | CUTANEOUS | 0 refills | Status: DC

## 2022-07-16 MED ORDER — HALOBETASOL PROPIONATE 0.05 % EX CREA: TOPICAL_CREAM | Freq: Two times a day (BID) | CUTANEOUS | 0 refills | Status: AC

## 2022-07-16 NOTE — Telephone Encounter (Signed)
[facility] called. They informed me that the Opzelura is not covered by insurance and she is not eligible for the coupons. They are needing an alternative medication. Please advise.

## 2022-07-16 NOTE — Telephone Encounter (Signed)
That's unfortunate.  We can change to Halobetasol cream BID for 2 weeks, then stop and change to Elidel Cream BID for 2 weeks (name brand needed) 100gm.  Keep alternating until clear.  Thanks!

## 2022-07-16 NOTE — Telephone Encounter (Signed)
Changes made

## 2022-08-20 ENCOUNTER — Ambulatory Visit: Payer: No Typology Code available for payment source | Admitting: Dermatology

## 2022-08-20 ENCOUNTER — Encounter: Payer: Self-pay | Admitting: Dermatology

## 2022-08-20 DIAGNOSIS — L309 Dermatitis, unspecified: Secondary | ICD-10-CM | POA: Diagnosis not present

## 2022-08-20 DIAGNOSIS — L2089 Other atopic dermatitis: Secondary | ICD-10-CM

## 2022-08-20 DIAGNOSIS — T7840XA Allergy, unspecified, initial encounter: Secondary | ICD-10-CM

## 2022-08-20 DIAGNOSIS — R21 Rash and other nonspecific skin eruption: Secondary | ICD-10-CM

## 2022-08-20 MED ORDER — TACROLIMUS 0.1 % EX OINT
TOPICAL_OINTMENT | Freq: Two times a day (BID) | CUTANEOUS | 3 refills | Status: DC
Start: 2022-08-20 — End: 2023-01-16

## 2022-08-20 MED ORDER — PREDNISONE 10 MG PO TABS
ORAL_TABLET | ORAL | 0 refills | Status: DC
Start: 2022-08-20 — End: 2022-09-03

## 2022-08-20 MED ORDER — CLOBETASOL PROPIONATE 0.05 % EX OINT: 1.0000 | TOPICAL_OINTMENT | Freq: Two times a day (BID) | CUTANEOUS | 3 refills | Status: DC

## 2022-08-20 NOTE — Patient Instructions (Addendum)
Plan: Counseling I counseled the patient regarding the following: Skin care: Patient should bathe using lukewarm water with a mild cleanser and moisturize immediately after. Emollients should be applied at least 2-3 times daily. Avoid scented detergents or fabric softeners. Keep fingernails short. Avoid excessive hand washing. Expectations: The patient is aware that eczema is chronic in nature and can improve with moisturizers and topical steroids and worsen with stress, scented soaps, detergents, scratching, dry skin, changes in weather and skin infections. Contact office if: Eczema worsens or fails to improve despite several weeks of treatment; patient develops skin infections (such as: yellow honey colored crusts or cold sores).  I recommended the following: - Advised to STOP Halobetasol -Start using Clobetasol for 2 weeks then STOP alternate with Tacrolimus while taking a break from the Clobetasol - Recommend starting oral dose of prednisone taper, Rx sent to pharmacy, Patient educated on how to take  -Labs ordered for allergy testing  The following medication counseling was provided: I discussed with the patient that prolonged use of topical steroids can result in the increased appearance of superficial blood vessels (telangiectasias), lightening (hypopigmentation) and thinning of the skin (atrophy). Patient understands to avoid using high potency steroids in skin folds, the groin or the face. The patient verbalized understanding of the proper use and possible adverse effects of topical steroids. All of the patient's questions and concerns were addressed.  Due to recent changes in healthcare laws, you may see results of your pathology and/or laboratory studies on MyChart before the doctors have had a chance to review them. We understand that in some cases there may be results that are confusing or concerning to you. Please understand that not all results are received at the same time and often  the doctors may need to interpret multiple results in order to provide you with the best plan of care or course of treatment. Therefore, we ask that you please give [place] 2 business days to thoroughly review all your results before contacting the office for clarification. Should we see a critical lab result, you will be contacted sooner.   If You Need Anything After Your Visit  If you have any questions or concerns for your doctor, please call our main line at [phone] If no one answers, please leave a voicemail as directed and we will return your call as soon as possible. Messages left after 4 pm will be answered the following business day.   You may also send [place] a message via MyChart. We typically respond to MyChart messages within 1-2 business days.  For prescription refills, please ask your pharmacy to contact our office. Our fax number is [phone].  If you have an urgent issue when the clinic is closed that cannot wait until the next business day, you can page your doctor at the number below.    Please note that while we do our best to be available for urgent issues outside of office hours, we are not available 24/7.   If you have an urgent issue and are unable to reach [place], you may choose to seek medical care at your doctor's office, retail clinic, urgent care center, or emergency room.  If you have a medical emergency, please immediately call 911 or go to the emergency department. In the event of inclement weather, please call our main line at [phone] for an update on the status of any delays or closures.  Dermatology Medication Tips: Please keep the boxes that topical medications come in in order to  help keep track of the instructions about where and how to use these. Pharmacies typically print the medication instructions only on the boxes and not directly on the medication tubes.   If your medication is too expensive, please contact our office at [phone] or send [place] a  message through MyChart.   We are unable to tell what your co-pay for medications will be in advance as this is different depending on your insurance coverage. However, we may be able to find a substitute medication at lower cost or fill out paperwork to get insurance to cover a needed medication.   If a prior authorization is required to get your medication covered by your insurance company, please allow [place] 1-2 business days to complete this process.  Drug prices often vary depending on where the prescription is filled and some pharmacies may offer cheaper prices.  The website www.goodrx.com contains coupons for medications through different pharmacies. The prices here do not account for what the cost may be with help from insurance (it may be cheaper with your insurance), but the website can give you the price if you did not use any insurance.  - You can print the associated coupon and take it with your prescription to the pharmacy.  - You may also stop by our office during regular business hours and pick up a GoodRx coupon card.  - If you need your prescription sent electronically to a different pharmacy, notify our office through [facility] or by phone at [phone]

## 2022-08-20 NOTE — Progress Notes (Signed)
   Follow-Up Visit   Subjective  Natasha Donovan is a 57 y.o. female who presents for the following: Atopic Dermatitis  Patient present today for follow up visit for atopic Dermatitis. Patient was last evaluated on 07/10/22. Patient reports sxs are worst.  Pt was rx'd ozempic by pcp to get weight and co-mordidietis under better control before starting JAK-inhibitor.  Patient reports after taking the 3rd dose of Ozempic, she developed a uncontrolled rash on her back. Her last dose of Ozempic was on May 17th, 2024.   Patient also tried to pin point reactions from foods in her diet. She noticed itching and skin irration when she ate fresh fish &peanuts.   Patient reports her insurance did not cover Opzelura so she continues to use Halobetasol and Clotrimazole-betamethasone.  The following portions of the chart were reviewed this encounter and updated as appropriate: medications, allergies, medical history  Review of Systems:  No other skin or systemic complaints except as noted in HPI or Assessment and Plan.  Objective  Well appearing patient in no apparent distress; mood and affect are within normal limits.   A focused examination was performed of the following areas: Back, Arms and hands  Relevant exam findings are noted in the Assessment and Plan.    Assessment & Plan   Dermatitis  Exam: Scaly violaceous papules coalescing to plaques, BSA: 70%, IGA: 3   Treatment Plan: - Advised to STOP Halobetasol -Start using Clobetasol for 2 weeks then STOP alternate with Tacrolimus while taking a break from the Clobetasol - Recommend starting oral dose of prednisone taper, Rx sent to pharmacy, Patient educated on how to take  -Labs ordered for allergy testing   Other atopic dermatitis  Related Procedures IgE Peanut Component Profile Milk IgE Wheat IgE Beef IgE IgE Nut Prof. w/Component Rflx  Related Medications clobetasol ointment (TEMOVATE) 0.05 % Apply 1 Application topically 2  (two) times daily.  predniSONE (DELTASONE) 10 MG tablet Take 3 tablets (30 mg total) by mouth daily with breakfast for 4 days, THEN 2 tablets (20 mg total) daily with breakfast for 4 days, THEN 1 tablet (10 mg total) daily with breakfast for 4 days. 3 tablets.  tacrolimus (PROTOPIC) 0.1 % ointment Apply topically 2 (two) times daily.  Rash (? Drug rash from Ozempic vs allergy to nuts) Exam: no active lesions today  Treatment Plan: - Itchy and rash are resolving -Will do labwork to screen for nut allergy -Already stopped ozempic  Return in about 4 weeks (around 09/17/2022) for Atopic Dermatitis F/U.  Documentation: I have reviewed the above documentation for accuracy and completeness, and I agree with the above.  Stasia Cavalier, am acting as scribe for Langston Reusing, DO.  Langston Reusing, DO

## 2022-08-21 ENCOUNTER — Ambulatory Visit: Payer: No Typology Code available for payment source | Admitting: Dermatology

## 2022-08-22 LAB — IGE PEANUT COMPONENT PROFILE
F352-IgE Ara h 8: <0.1 kU/L
F422-IgE Ara h 1: <0.1 kU/L
F423-IgE Ara h 2: <0.1 kU/L
F424-IgE Ara h 3: <0.1 kU/L
F427-IgE Ara h 9: <0.1 kU/L
F447-IgE Ara h 6: <0.1 kU/L

## 2022-08-22 LAB — IGE NUT PROF. W/COMPONENT RFLX
F017-IgE Hazelnut (Filbert): <0.1 kU/L
F018-IgE Brazil Nut: <0.1 kU/L
F020-IgE Almond: <0.1 kU/L
F202-IgE Cashew Nut: <0.1 kU/L
F203-IgE Pistachio Nut: 0.15 kU/L — AB
F256-IgE Walnut: <0.1 kU/L
Macadamia Nut, IgE: 0.11 kU/L — AB
Peanut, IgE: <0.1 kU/L
Pecan Nut IgE: <0.1 kU/L

## 2022-08-22 LAB — ALLERGEN MILK: Milk IgE: <0.1 kU/L

## 2022-08-22 LAB — ALLERGEN, WHEAT, F4: Wheat IgE: <0.1 kU/L

## 2022-08-22 LAB — ALLERGEN BEEF: Beef IgE: <0.1 kU/L

## 2022-09-03 ENCOUNTER — Encounter: Payer: Self-pay | Admitting: Dermatology

## 2022-09-03 ENCOUNTER — Other Ambulatory Visit: Payer: Self-pay

## 2022-09-03 DIAGNOSIS — L2089 Other atopic dermatitis: Secondary | ICD-10-CM

## 2022-09-03 MED ORDER — PREDNISONE 10 MG PO TABS: 10.0000 mg | ORAL_TABLET | Freq: Every day | ORAL | 0 refills | Status: AC

## 2022-09-23 ENCOUNTER — Encounter: Payer: Self-pay | Admitting: Dermatology

## 2022-09-24 ENCOUNTER — Encounter: Payer: Self-pay | Admitting: Dermatology

## 2022-09-24 ENCOUNTER — Ambulatory Visit (INDEPENDENT_AMBULATORY_CARE_PROVIDER_SITE_OTHER): Payer: No Typology Code available for payment source | Admitting: Dermatology

## 2022-09-24 VITALS — BP 145/92 | HR 84

## 2022-09-24 DIAGNOSIS — L309 Dermatitis, unspecified: Secondary | ICD-10-CM | POA: Diagnosis not present

## 2022-09-24 DIAGNOSIS — L2089 Other atopic dermatitis: Secondary | ICD-10-CM

## 2022-09-24 MED ORDER — OPZELURA 1.5 % EX CREA: TOPICAL_CREAM | CUTANEOUS | 2 refills | Status: DC

## 2022-09-24 NOTE — Progress Notes (Unsigned)
   Follow-Up Visit   Subjective  Natasha Donovan is a 57 y.o. female who presents for the following: Atopic Dermatitis  Patient present today for follow up visit for atopic Dermatitis. Patient was last evaluated on 08/20/22. Patient reports symptoms are better than they were.  Pt was rx'd ozempic by pcp to get weight and co-mordidietis under better control before starting JAK-inhibitor (Rinvoq).  Patient reports after taking the 3rd dose of Ozempic, she developed a uncontrolled rash on her back. Her last dose of Ozempic was on May 17th, 2024.   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 States does have itching when eats peanuts.   Has been using Clobetasol ointment alternating with Tacrolimus ointment since last visit. Seems to be working better than previous topicals. Finished oral prednisone taper since last visit. Bumps have recurred on back and eczema has flared since finishing oral prednisone. Finished prednisone 1 1/2 weeks ago. Using Sarna lotion, helps quite a bit.   Would like to discuss adding in Cephalexin. Has helped in the past.   The following portions of the chart were reviewed this encounter and updated as appropriate: medications, allergies, medical history  Review of Systems:  No other skin or systemic complaints except as noted in HPI or Assessment and Plan.  Objective  Well appearing patient in no apparent distress; mood and affect are within normal limits.   A focused examination was performed of the following areas: Back, Arms and hands  Relevant exam findings are noted in the Assessment and Plan.    Assessment & Plan   Dermatitis  Exam: Scaly violaceous papules coalescing to plaques, BSA: 70%, IGA: 3   Treatment Plan:  -Continue using Clobetasol for 2 weeks on 2 weeks off alternating with Tacrolimus until Opzelura is approved. -Start Aquaphor spray as many times a day as needed. -Will re-submit for Opzelura since not  controlled on current treatment regimen. Apply twice daily to affected areas  Do not recommend oral prednisone more than 2-3 times per year.  Risks of prednisone taper discussed including mood irritability, insomnia, weight gain, stomach ulcers, increased risk of infection, increased blood sugar (diabetes), hypertension, osteoporosis with long-term or frequent use, and rare risk of avascular necrosis of the hip.    Rash (? Drug rash from Ozempic vs allergy to nuts) Exam: no active lesions today  Discussed allergy test results today.   Treatment Plan: -Avoid eating peanuts.  Return in about 3 months (around 12/25/2022) for Atopic Dermatitis Follow Up.  Documentation: I have reviewed the above documentation for accuracy and completeness, and I agree with the above.  ILawson Radar, CMA, am acting as scribe for Langston Reusing, DO.   Langston Reusing, DO

## 2022-09-24 NOTE — Patient Instructions (Addendum)
Thank you for visiting [place] again and discussing the management of your atopic dermatitis. We appreciate your dedication to improving your health and are committed to supporting you in this journey.  Here is a summary of the key instructions from today's consultation:  - Medications: Continue using Clobetasol and Tacrolimus topically as directed, alternating every two weeks.  - New Prescription: We will resubmit for Opzelura, pending approval. Continue current topicals until further notice.  - Skin Care Recommendations:   - Use Sarna lotion for relief and consider Aquaphor spray to lock in moisture, especially during warmer weather.    - Avoid heavy creams like cocoa butter during hot days to prevent exacerbation of symptoms.  - Allergy Management: Avoid nuts, particularly macadamia and pistachios, to which you have shown allergic reactions.  - Steroid Use Caution: Limit the use of oral steroids like prednisone due to potential long-term bone and metabolic effects.  - Follow-Up: We plan to see you in three months to assess the effectiveness of the new treatment regimen once Opzelura is initiated.  Please ensure to contact us through MyChart for any immediate concerns or if you require additional refills before your next appointment.  We look forward to seeing you again and wish you a smooth continuation of your treatment.    -Continue using Clobetasol for 2 weeks on 2 weeks off alternating with Tacrolimus until Opzelura is approved. -Start Aquaphor spray as many times a day as needed. -Will re-submit for Opzelura since not controlled on current treatment regimen. -Opzelura Apply twice daily to affected areas  Do not recommend oral prednisone more than 2-3 times per year.  Risks of prednisone taper discussed including mood irritability, insomnia, weight gain, stomach ulcers, increased risk of infection, increased blood sugar (diabetes), hypertension, osteoporosis with long-term or frequent  use, and rare risk of avascular necrosis of the hip.    Avoid eating peanuts      Your prescription was sent to [facility] in [place]. A representative from [employer] will contact you within 2 business hours to verify your address and insurance information to schedule a free delivery. If for any reason you do not receive a phone call from them, please reach out to them. Their phone number is [phone] and their hours are Monday-Friday 9:00 am-5:00 pm.      Due to recent changes in healthcare laws, you may see results of your pathology and/or laboratory studies on MyChart before the doctors have had a chance to review them. We understand that in some cases there may be results that are confusing or concerning to you. Please understand that not all results are received at the same time and often the doctors may need to interpret multiple results in order to provide you with the best plan of care or course of treatment. Therefore, we ask that you please give [place] 2 business days to thoroughly review all your results before contacting the office for clarification. Should we see a critical lab result, you will be contacted sooner.   If You Need Anything After Your Visit  If you have any questions or concerns for your doctor, please call our main line at [phone] If no one answers, please leave a voicemail as directed and we will return your call as soon as possible. Messages left after 4 pm will be answered the following business day.   You may also send [place] a message via MyChart. We typically respond to MyChart messages within 1-2 business days.  For prescription refills,  please ask your pharmacy to contact our office. Our fax number is [phone].  If you have an urgent issue when the clinic is closed that cannot wait until the next business day, you can page your doctor at the number below.    Please note that while we do our best to be available for urgent issues outside  of office hours, we are not available 24/7.   If you have an urgent issue and are unable to reach [place], you may choose to seek medical care at your doctor's office, retail clinic, urgent care center, or emergency room.  If you have a medical emergency, please immediately call 911 or go to the emergency department. In the event of inclement weather, please call our main line at [phone] for an update on the status of any delays or closures.  Dermatology Medication Tips: Please keep the boxes that topical medications come in in order to help keep track of the instructions about where and how to use these. Pharmacies typically print the medication instructions only on the boxes and not directly on the medication tubes.   If your medication is too expensive, please contact our office at [phone] or send [place] a message through MyChart.   We are unable to tell what your co-pay for medications will be in advance as this is different depending on your insurance coverage. However, we may be able to find a substitute medication at lower cost or fill out paperwork to get insurance to cover a needed medication.   If a prior authorization is required to get your medication covered by your insurance company, please allow [place] 1-2 business days to complete this process.  Drug prices often vary depending on where the prescription is filled and some pharmacies may offer cheaper prices.  The website www.goodrx.com contains coupons for medications through different pharmacies. The prices here do not account for what the cost may be with help from insurance (it may be cheaper with your insurance), but the website can give you the price if you did not use any insurance.  - You can print the associated coupon and take it with your prescription to the pharmacy.  - You may also stop by our office during regular business hours and pick up a GoodRx coupon card.  - If you need your prescription sent electronically to a  different pharmacy, notify our office through [facility] or by phone at [phone]

## 2022-09-30 ENCOUNTER — Encounter: Payer: Self-pay | Admitting: Dermatology

## 2022-09-30 NOTE — Telephone Encounter (Signed)
Can we file and appeal for the Opzelora using the AAD letter generator.

## 2022-10-09 ENCOUNTER — Telehealth: Payer: Self-pay

## 2022-10-09 NOTE — Telephone Encounter (Signed)
Pt called saying she received message from hollie needing her to sign to get a prior auth for her medication Opzelora. I sent her a message saying she could sign form she has and fax to insurance directly

## 2022-10-17 ENCOUNTER — Telehealth: Payer: Self-pay

## 2022-10-17 NOTE — Telephone Encounter (Signed)
Sarah pharmacist in the Appeal Dept at [facility] left a voicemail asking for a call back regarding Appeal. Her ext is [phone] at [phone]. I called and spoke to Maralyn Sago on 10/17/22 and she asked for clarification on past usage/failure on Elidel and protopic meds. I let her know patient had tried and failed both medications. The pharmacist said it looks like the Appeal will be authorized.

## 2022-11-12 ENCOUNTER — Encounter: Payer: Self-pay | Admitting: Dermatology

## 2022-12-12 ENCOUNTER — Other Ambulatory Visit: Payer: Self-pay | Admitting: Dermatology

## 2022-12-12 DIAGNOSIS — L2089 Other atopic dermatitis: Secondary | ICD-10-CM

## 2022-12-26 ENCOUNTER — Ambulatory Visit: Payer: No Typology Code available for payment source | Admitting: Dermatology

## 2022-12-26 VITALS — BP 141/81

## 2022-12-26 DIAGNOSIS — G479 Sleep disorder, unspecified: Secondary | ICD-10-CM

## 2022-12-26 DIAGNOSIS — L2089 Other atopic dermatitis: Secondary | ICD-10-CM

## 2022-12-26 DIAGNOSIS — L299 Pruritus, unspecified: Secondary | ICD-10-CM | POA: Diagnosis not present

## 2022-12-26 DIAGNOSIS — L209 Atopic dermatitis, unspecified: Secondary | ICD-10-CM | POA: Diagnosis not present

## 2022-12-26 MED ORDER — DUPIXENT 300 MG/2ML ~~LOC~~ SOAJ: 300.0000 mg | SUBCUTANEOUS | 4 refills | Status: DC

## 2022-12-26 MED ORDER — DUPIXENT 300 MG/2ML ~~LOC~~ SOAJ: 600.0000 mg | Freq: Once | SUBCUTANEOUS | 0 refills | Status: AC

## 2022-12-26 NOTE — Progress Notes (Addendum)
   Follow-Up Visit   Subjective  Natasha Donovan is a 57 y.o. female who presents for the following: Atopic dermatitis follow up - she is doing much better. She was still not able to Opzelura. She is alternating Clobetasol and Tacrolimus every 2 weeks. Sh does have some itching on her back but her legs are much better.   The following portions of the chart were reviewed this encounter and updated as appropriate: medications, allergies, medical history  Review of Systems:  No other skin or systemic complaints except as noted in HPI or Assessment and Plan.  Objective  Well appearing patient in no apparent distress; mood and affect are within normal limits.   A focused examination was performed of the following areas:   Relevant exam findings are noted in the Assessment and Plan.    Assessment & Plan   1. Atopic Dermatitis (Eczema) Exam: Scaly violaceous plaques. BSA: 50% IGA: 3    - Assessment: Patient continues to experience symptoms of atopic dermatitis.    - Plan:      - Continue application of clobetasol ointment twice daily for two weeks, alternating with tacrolimus ointment twice daily for the subsequent two weeks.      - Initiate Dupixent therapy, pending insurance approval. Assist patient with the enrollment form and coordinate medication delivery with [facility].      - Provide samples of Aquaphor eczema lotions containing colloidal oatmeal for adjunctive treatment.      - Instruct patient to use Aquaphor spray to seal in moisture after bathing.  2. Itch and Sleep Disturbance    - Assessment: Patient reports significant itching and sleep disturbance.    - Plan:      - Initiate Dupixent therapy, as it is approved for both itch and atopic dermatitis. Monitor patient for improvement in itching and sleep quality.      Plan: Dupixent Initiation Indications:  Patient isn't a candidate for systemic therapy with methotrexate or cyclosporine. Patient has been  unresponsive to aggressive topical therapy.  Failed Treatments: Topical Steroids and Topical Protopic  Treatment Protocol: 600 mg [place] day 0 then 300 mg [place] every other week  Specific Contraindications Cyclosporine is contraindicated because the patient will not be able to complete the necessary follow-up labs. Methotrexate is contraindicated because the patient will not be able to complete the necessary follow-up labs. Phototherapy is contraindicated because the patient lives too far from the treatment location.   Dupixent Counseling: I discussed with the patient the risks of dupilumab including but not limited to eye infection and irritation, cold sores, injection site reactions, worsening of asthma, allergic reactions and increased risk of parasitic infection. Live vaccines should be avoided while taking dupilumab. Dupilumab will also interact with certain medications such as warfarin and cyclosporine. The patient understands that monitoring is required and they must alert [place] or the primary physician if symptoms of infection or other concerning signs are noted.  Dupixent Monitoring: There is no laboratory monitoring requirement with Dupixent.    No follow-ups on file.  I, Joanie Coddington, CMA, am acting as scribe for [employer], DO .   Documentation: I have reviewed the above documentation for accuracy and completeness, and I agree with the above.  Langston Reusing, DO

## 2022-12-26 NOTE — Patient Instructions (Signed)
Hello Jaki,  Thank you for visiting my office today. Your dedication to improving your skin health is greatly appreciated. Here is a summary of the key instructions from today's consultation:  - Initiate Dupixent Treatment: We will begin the process for Dupixent, which may take about three weeks for approval. This medication is an injectable biologic used for atopic dermatitis.   - Administration: It is administered via an auto-injector every two weeks, alternating legs.   - Storage: Keep the medication in the fridge and allow it to reach room temperature before injecting to reduce stinging.  - Current Topical Treatments:   - Clobetasol: Continue using clobetasol twice a day for two weeks.   - Tacrolimus: Alternate with tacrolimus twice a day for two weeks.   - Moisturizing: Additionally, use Aquaphor spray to seal in moisture.  - Additional Support: I have provided samples of Aquaphor eczema lotions with colloidal oatmeal to soothe the skin. These are supplementary to your prescribed treatments.  - Pharmacy Coordination: My assistant will assist with the enrollment form for Dupixent, and you will be contacted by [facility] for medication delivery. The pharmaceutical company often covers most costs, ensuring you pay no more than $5.  - Follow-Up: We will monitor your response to Dupixent, especially concerning the previous concern about foot pain. If this recurs, we will consider alternative treatments.  Please feel free to reach out if you have any questions or need further assistance. We are here to support you every step of the way.  Best regards,  Dr. Langston Reusing Dermat[facility]

## 2022-12-30 ENCOUNTER — Encounter: Payer: Self-pay | Admitting: Dermatology

## 2023-01-15 ENCOUNTER — Other Ambulatory Visit: Payer: Self-pay | Admitting: Dermatology

## 2023-01-15 DIAGNOSIS — L2089 Other atopic dermatitis: Secondary | ICD-10-CM

## 2023-01-16 ENCOUNTER — Other Ambulatory Visit: Payer: Self-pay

## 2023-01-16 DIAGNOSIS — L2089 Other atopic dermatitis: Secondary | ICD-10-CM

## 2023-01-16 MED ORDER — TACROLIMUS 0.1 % EX OINT: TOPICAL_OINTMENT | Freq: Two times a day (BID) | CUTANEOUS | 3 refills | Status: DC

## 2023-01-16 NOTE — Progress Notes (Signed)
Refill requested and sent.

## 2023-02-09 ENCOUNTER — Other Ambulatory Visit: Payer: Self-pay | Admitting: Dermatology

## 2023-02-09 DIAGNOSIS — L2089 Other atopic dermatitis: Secondary | ICD-10-CM

## 2023-03-08 ENCOUNTER — Other Ambulatory Visit: Payer: Self-pay | Admitting: Dermatology

## 2023-03-08 DIAGNOSIS — L2089 Other atopic dermatitis: Secondary | ICD-10-CM

## 2023-04-03 ENCOUNTER — Other Ambulatory Visit: Payer: Self-pay | Admitting: Dermatology

## 2023-04-03 ENCOUNTER — Ambulatory Visit: Payer: No Typology Code available for payment source | Admitting: Dermatology

## 2023-04-03 DIAGNOSIS — L2089 Other atopic dermatitis: Secondary | ICD-10-CM

## 2023-04-28 ENCOUNTER — Other Ambulatory Visit: Payer: Self-pay | Admitting: Dermatology

## 2023-04-28 DIAGNOSIS — L2089 Other atopic dermatitis: Secondary | ICD-10-CM

## 2023-05-23 ENCOUNTER — Other Ambulatory Visit: Payer: Self-pay | Admitting: Dermatology

## 2023-05-23 DIAGNOSIS — L2089 Other atopic dermatitis: Secondary | ICD-10-CM

## 2023-06-10 ENCOUNTER — Ambulatory Visit: Payer: No Typology Code available for payment source | Admitting: Dermatology

## 2023-06-10 ENCOUNTER — Other Ambulatory Visit: Payer: Self-pay

## 2023-06-10 DIAGNOSIS — L2089 Other atopic dermatitis: Secondary | ICD-10-CM

## 2023-06-10 MED ORDER — DUPIXENT 300 MG/2ML ~~LOC~~ SOAJ: 300.0000 mg | SUBCUTANEOUS | 4 refills | Status: AC

## 2023-06-10 MED ORDER — DUPIXENT 300 MG/2ML ~~LOC~~ SOAJ: 600.0000 mg | Freq: Once | SUBCUTANEOUS | 0 refills | Status: AC

## 2023-06-10 NOTE — Progress Notes (Signed)
 Spoke with pt. She informed me that she was receiving her Dupixent injections and noticed improvement but then she stopped injecting because it became too difficult for her to inject herself. I provided pt with verbal instructions on tactics on how to distract her mind from focusing on the actual pain from the injection such as a ice pack or gripping the injection site tightly prior to injecting to distract her mind from the pain pf the needle. Patient voiced understanding. I advised I would resubmit her Auth because the one on file is now expired. Patient voiced her understanding.

## 2023-06-22 ENCOUNTER — Other Ambulatory Visit: Payer: Self-pay | Admitting: Dermatology

## 2023-06-22 DIAGNOSIS — L2089 Other atopic dermatitis: Secondary | ICD-10-CM

## 2023-07-07 ENCOUNTER — Other Ambulatory Visit: Payer: Self-pay | Admitting: Obstetrics and Gynecology

## 2023-07-07 DIAGNOSIS — N644 Mastodynia: Secondary | ICD-10-CM

## 2023-07-18 ENCOUNTER — Ambulatory Visit
Admission: RE | Admit: 2023-07-18 | Discharge: 2023-07-18 | Disposition: A | Discharge status: Home / Self Care | Payer: Self-pay | Source: Ambulatory Visit | Attending: Obstetrics and Gynecology | Admitting: Obstetrics and Gynecology

## 2023-07-18 DIAGNOSIS — N644 Mastodynia: Secondary | ICD-10-CM

## 2023-07-31 ENCOUNTER — Telehealth: Payer: Self-pay

## 2023-07-31 NOTE — Telephone Encounter (Signed)
 Pt called and LVM requesting the status of her Dupixent . She also requested to have a provider come to [facility] to see if anything additional can be done because she is having a bad Eczema flare on her back.   I returned pt & I advised we are now having to re-authorize the medication because the first auth that was on file expired so the prescription has not been sent in yet. I also advised we do not have physicians located at the hospital so any appointments will have to take place at our office. Pt stated she will call back when she has time in her schedule. She is currently back and forth at the hospital with her  husband while he is in ICU and back and forth taking care of her sister and working. I advised we are here when she is able to schedule. I also advised I will place samples in the front office for her to pick up of eczema moisturizers to help when taking a break from the topical steroid. Patient voiced her understanding.

## 2023-08-01 IMAGING — MG DIGITAL DIAGNOSTIC BILAT W/ TOMO W/ CAD
8 series · 9 of 24 positions shown · non-contrast
Comparison: Previous exam(s).

CLINICAL DATA: Patient's screening mammogram dated 12/21/2019
demonstrates a possible left breast mass for which diagnostic left
breast ultrasound was recommended. Patient did not undergo the
ultrasound. This is her first mammogram since the 12/21/2019 exam.

EXAM:
DIGITAL DIAGNOSTIC BILATERAL MAMMOGRAM WITH TOMOSYNTHESIS AND CAD
TECHNIQUE: Bilateral digital diagnostic mammography and breast tomosynthesis
was performed. The images were evaluated with computer-aided
detection.

[L CC synth-2D]
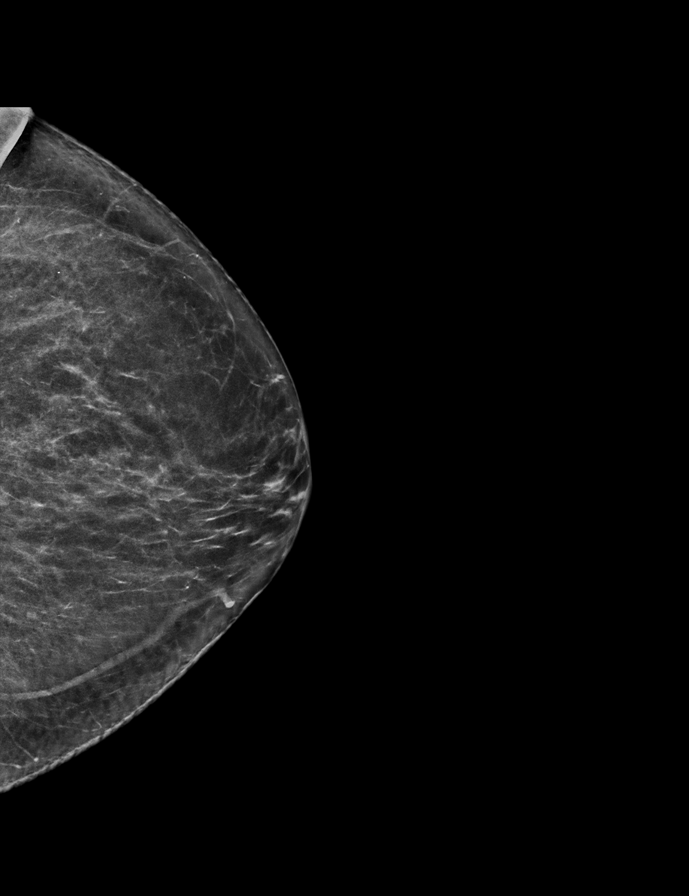

[R CC synth-2D]
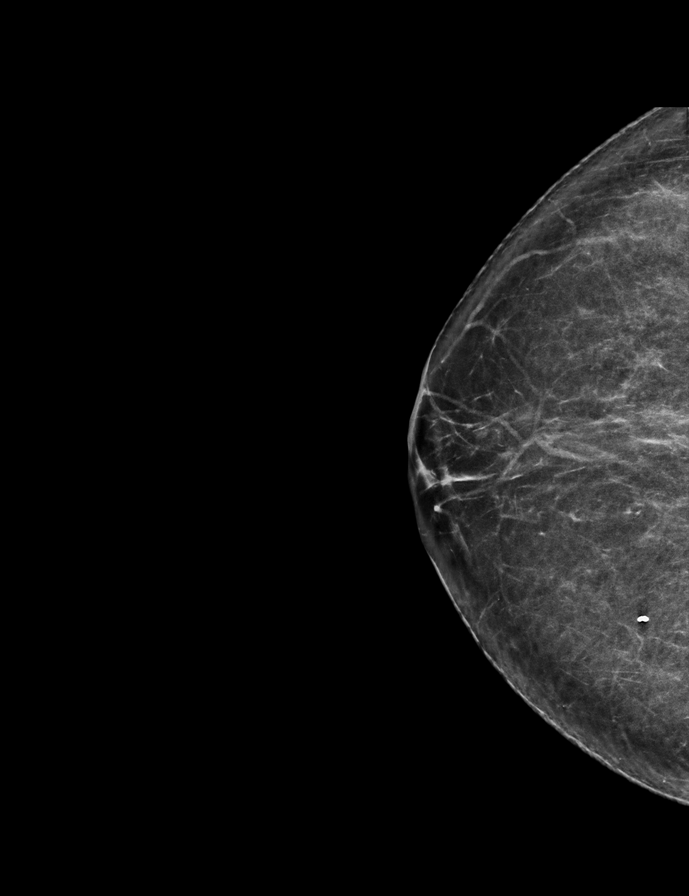

[R MLO synth-2D]
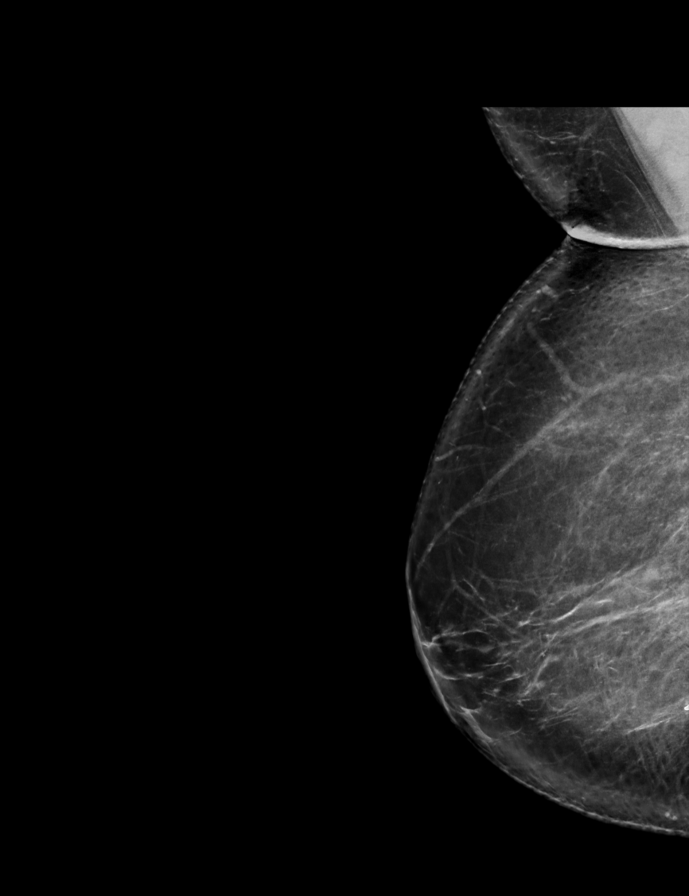

[L MLO synth-2D]
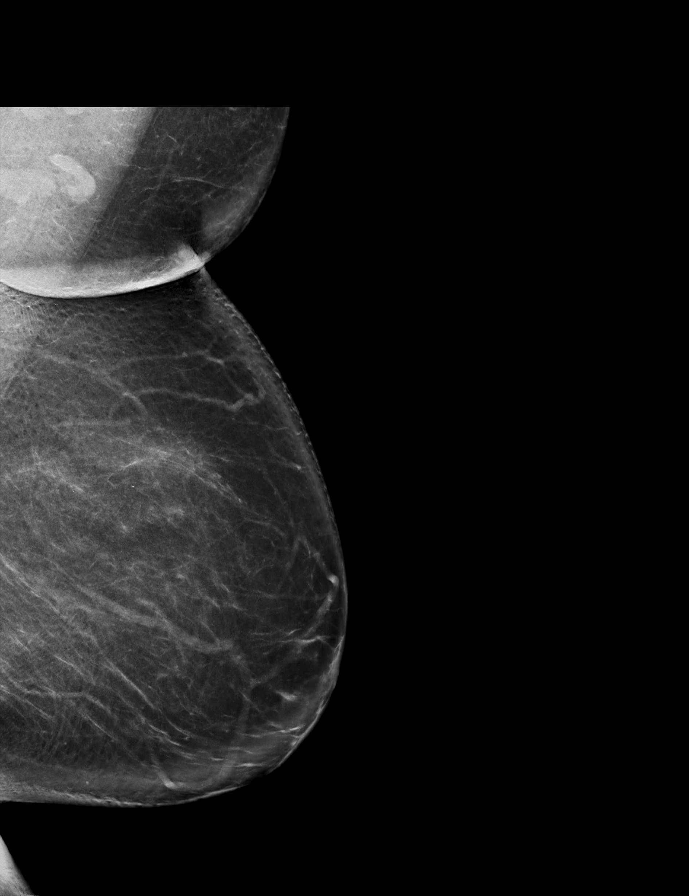

[L MLO tomo · 2 of 87 frames shown]
[frame 29/87]
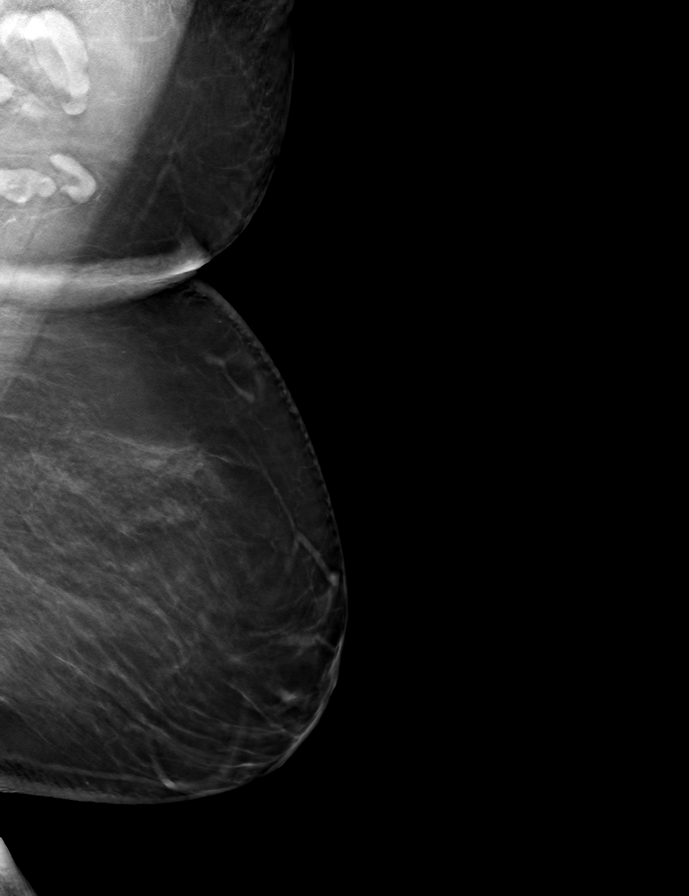
[frame 44/87]
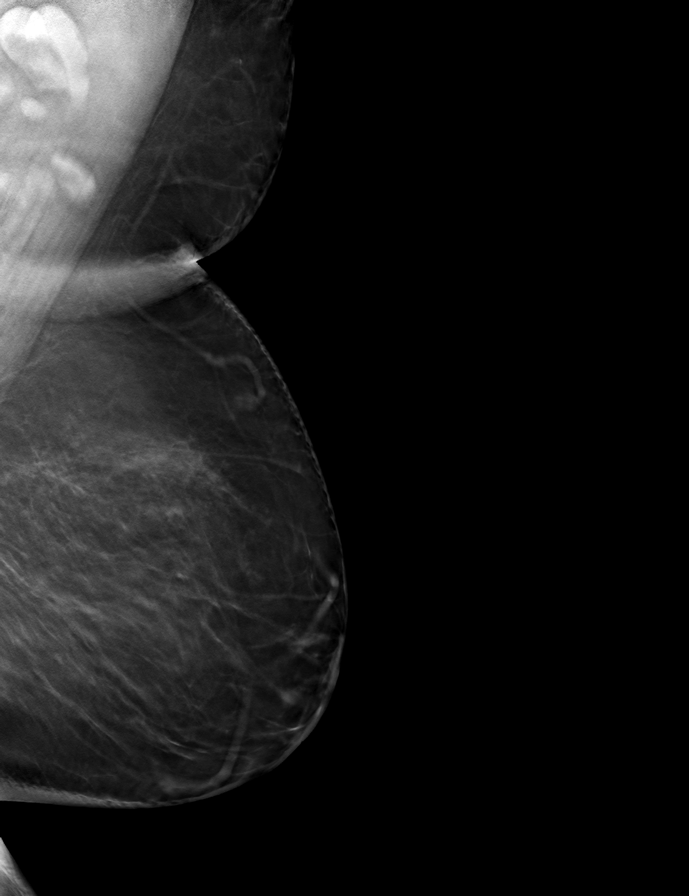

[L CC tomo · tomo slice 31/61.0]
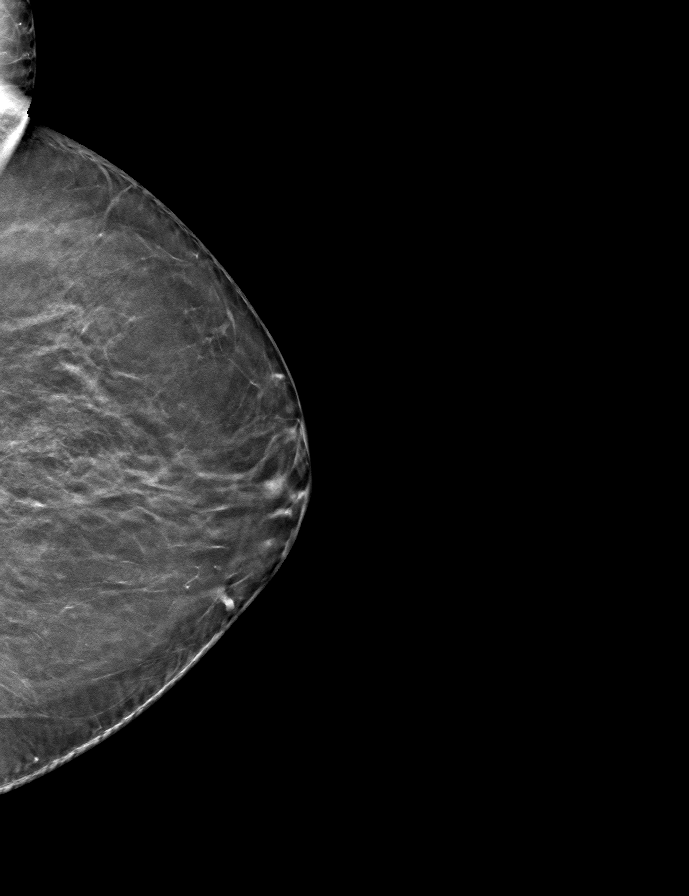

[R CC tomo · tomo slice 32/63.0]
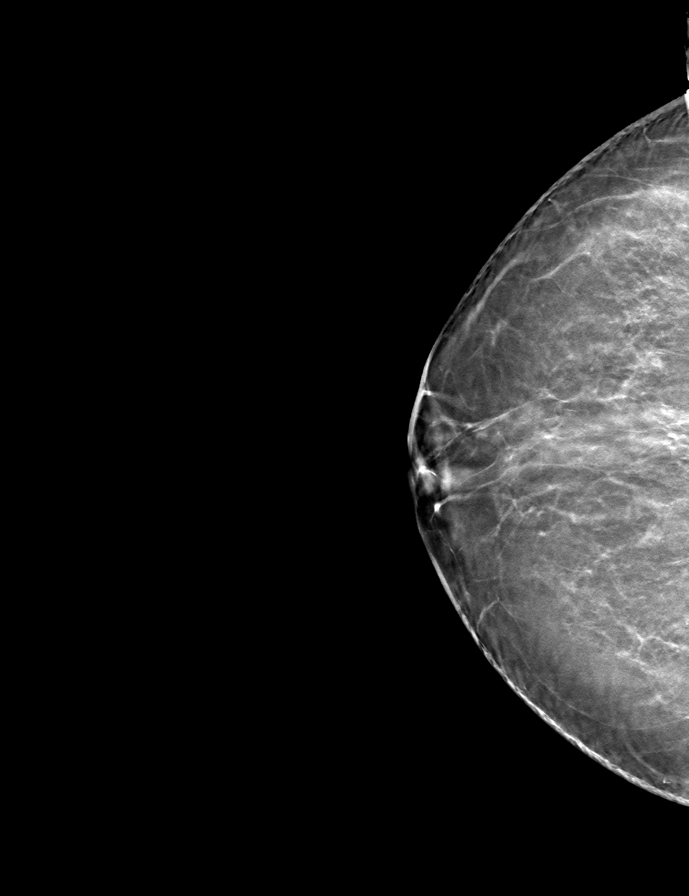

[R MLO tomo · tomo slice 41/81.0]
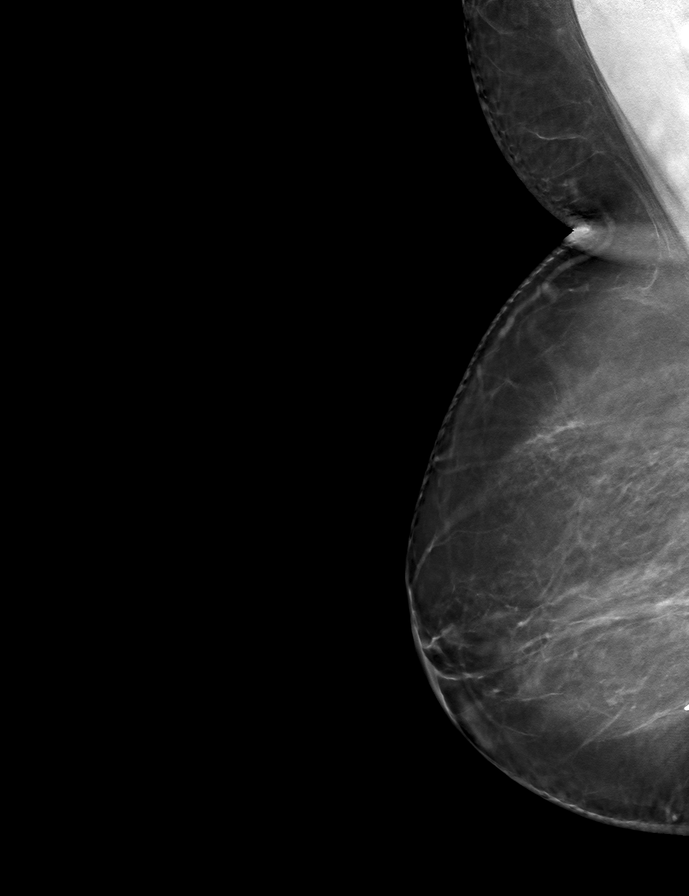

[9 of 24 positions shown; findings below may reference images not displayed]

ACR Breast Density Category b: There are scattered areas of
fibroglandular density.
FINDINGS: The small mass noted in the left breast on the prior study from
12/21/2019 is no longer visualized consistent with a resolved cyst.

There are no suspicious masses, areas of nonsurgical architectural
distortion, areas of significant asymmetry or suspicious
calcifications.
IMPRESSION: No evidence of breast malignancy.

RECOMMENDATION:
Screening mammogram in one year.(Code:GN-2-06B)

I have discussed the findings and recommendations with the patient.
If applicable, a reminder letter will be sent to the patient
regarding the next appointment.

BI-RADS CATEGORY  1: Negative.

## 2023-08-05 ENCOUNTER — Telehealth: Payer: Self-pay

## 2023-08-05 NOTE — Telephone Encounter (Signed)
 Spoke with Natasha Donovan With [facility] to provide verbal order for Dupixent  Injections 300 mg auto injector pens.   Spoke with Natasha Donovan to schedule injection teaching and 4 month follow up visit to document improvement on Dupixent  and to re-submit prior-auth. Her Current Prior Natasha Donovan is Valid from:  PA Date Range: 08/01/2023 through 12/02/2023 PA Number: [id number]  Patient voiced understanding.

## 2023-08-06 ENCOUNTER — Ambulatory Visit: Admitting: Dermatology

## 2023-08-06 DIAGNOSIS — L209 Atopic dermatitis, unspecified: Secondary | ICD-10-CM

## 2023-08-06 DIAGNOSIS — L2089 Other atopic dermatitis: Secondary | ICD-10-CM

## 2023-08-06 MED ORDER — LIDOCAINE 5 % EX OINT
1.0000 | TOPICAL_OINTMENT | CUTANEOUS | 4 refills | Status: AC | PRN
Start: 2023-08-06 — End: ?

## 2023-08-06 MED ORDER — DUPILUMAB 300 MG/2ML ~~LOC~~ SOAJ
600.0000 mg | Freq: Once | SUBCUTANEOUS | Status: AC
Start: 2023-08-06 — End: 2023-08-06
  Administered 2023-08-06: 600 mg via SUBCUTANEOUS

## 2023-08-06 NOTE — Progress Notes (Unsigned)
   Follow-Up Visit   Subjective  DEEPTI Donovan is a 58 y.o. female who presents for the following: Dupixent  Injection Teaching  Patient present today for follow up visit for Dupixent  Injection Teaching. Patient was last evaluated on 12/26/22. At this visit patient was prescribed Dupixent . Patient reports sxs are worse. Patient denies medication changes.  The following portions of the chart were reviewed this encounter and updated as appropriate: medications, allergies, medical history  Review of Systems:  No other skin or systemic complaints except as noted in HPI or Assessment and Plan.  Objective  Well appearing patient in no apparent distress; mood and affect are within normal limits.  A full examination was performed including scalp, head, eyes, ears, nose, lips, neck, chest, axillae, abdomen, back, buttocks, bilateral upper extremities, bilateral lower extremities, hands, feet, fingers, toes, fingernails, and toenails. All findings within normal limits unless otherwise noted below.   Relevant exam findings are noted in the Assessment and Plan.    Assessment & Plan   ATOPIC DERMATITIS Exam: Scaly pink papules coalescing to plaques 80% BSA IGA:4  Flared  Atopic dermatitis (eczema) is a chronic, relapsing, pruritic condition that can significantly affect quality of life. It is often associated with allergic rhinitis and/or asthma and can require treatment with topical medications, phototherapy, or in severe cases biologic injectable medication (Dupixent ; Adbry) or Oral JAK inhibitors.  Treatment Plan: - Instructed and demonstrated with pt with Training Dupixent  Pen how to properly inject - Educated on correct injection sites and how to alternate injection sites - Patient demonstrated understanding but did not feel comfortable injecting herself, Injections completed by CMA Acey Ace), Patient monitored for 15 mins.  - Patient tolerated well - Prescribed Lidocaine  ointment to apply  to the injection site when at home to help alleviate the pain during injections - Follow up in 2 weeks for next injection  - Recommend gentle skin care. OTHER ATOPIC DERMATITIS   Related Medications predniSONE  (DELTASONE ) 10 MG tablet Take 1 tablet (10 mg total) by mouth daily with breakfast for 7 days. tacrolimus  (PROTOPIC ) 0.1 % ointment Apply topically 2 (two) times daily. Dupilumab  (DUPIXENT ) 300 MG/2ML SOAJ Inject 300 mg into the skin every 14 (fourteen) days. Starting at day 15 for maintenance. clobetasol  ointment (TEMOVATE ) 0.05 % APPLY  OINTMENT TOPICALLY TO AFFECTED AREA TWICE DAILY Dupilumab  SOAJ 600 mg  lidocaine  (XYLOCAINE ) 5 % ointment Apply 1 Application topically as needed. Apply 1 hour before you inject to the injection site. WIPE OFF PRIOR TO INJECTING  Return in about 2 weeks (around 08/20/2023) for Dupixent  Injection Ok'd by JD.  I, Natasha Donovan, am acting as Neurosurgeon for [employer], DO.  Documentation: I have reviewed the above documentation for accuracy and completeness, and I agree with the above.  Louana Roup, DO

## 2023-08-13 ENCOUNTER — Encounter: Payer: Self-pay | Admitting: Dermatology

## 2023-08-20 ENCOUNTER — Ambulatory Visit (INDEPENDENT_AMBULATORY_CARE_PROVIDER_SITE_OTHER): Admitting: Dermatology

## 2023-08-20 DIAGNOSIS — L209 Atopic dermatitis, unspecified: Secondary | ICD-10-CM

## 2023-08-20 DIAGNOSIS — L2089 Other atopic dermatitis: Secondary | ICD-10-CM

## 2023-08-20 MED ORDER — CLOBETASOL PROPIONATE 0.05 % EX OINT: TOPICAL_OINTMENT | Freq: Two times a day (BID) | CUTANEOUS | 5 refills | Status: DC

## 2023-08-20 MED ORDER — DUPILUMAB 300 MG/2ML ~~LOC~~ SOAJ
300.0000 mg | Freq: Once | SUBCUTANEOUS | Status: AC
  Administered 2023-08-20: 300 mg via SUBCUTANEOUS

## 2023-08-20 NOTE — Patient Instructions (Signed)

## 2023-08-20 NOTE — Progress Notes (Unsigned)
   Follow-Up Visit   Subjective  Natasha Donovan is a 58 y.o. female who presents for the following: Dupixent  Injection  Patient present today for nurse visit. Patient was last evaluated on 08/06/23. Patient is here for Dupixent  administration. Patient mentioned that she also needs refills of clobetasol .  Patient denies medication changes.  The following portions of the chart were reviewed this encounter and updated as appropriate: medications, allergies, medical history  Review of Systems:  No other skin or systemic complaints except as noted in HPI or Assessment and Plan.  Objective  Well appearing patient in no apparent distress; mood and affect are within normal limits.  Relevant exam findings are noted in the Assessment and Plan.    Assessment & Plan   ATOPIC DERMATITIS Exam: Scaly pink papules coalescing to plaques 80% BSA per last OV note  flared  Treatment Plan: - Clobetasol  cream refills sent - Dupixent  administered into L thigh per pt request. Pt tolerated well.  Pt supplied medication NDC 2956-2130-86 Lot [id number] Exp. 03-17-25   No follow-ups on file.    Documentation: I have reviewed the above documentation for accuracy and completeness, and I agree with the above.  Shirron [place], [place]

## 2023-09-03 ENCOUNTER — Encounter: Payer: Self-pay | Admitting: Dermatology

## 2023-09-03 ENCOUNTER — Ambulatory Visit: Admitting: Dermatology

## 2023-09-03 DIAGNOSIS — I10 Essential (primary) hypertension: Secondary | ICD-10-CM | POA: Insufficient documentation

## 2023-09-03 DIAGNOSIS — E119 Type 2 diabetes mellitus without complications: Secondary | ICD-10-CM | POA: Insufficient documentation

## 2023-09-03 DIAGNOSIS — L239 Allergic contact dermatitis, unspecified cause: Secondary | ICD-10-CM | POA: Insufficient documentation

## 2023-09-03 DIAGNOSIS — N941 Unspecified dyspareunia: Secondary | ICD-10-CM | POA: Insufficient documentation

## 2023-09-03 DIAGNOSIS — R21 Rash and other nonspecific skin eruption: Secondary | ICD-10-CM | POA: Diagnosis not present

## 2023-09-03 DIAGNOSIS — L308 Other specified dermatitis: Secondary | ICD-10-CM | POA: Diagnosis not present

## 2023-09-03 DIAGNOSIS — L209 Atopic dermatitis, unspecified: Secondary | ICD-10-CM | POA: Diagnosis not present

## 2023-09-03 DIAGNOSIS — L2089 Other atopic dermatitis: Secondary | ICD-10-CM

## 2023-09-03 DIAGNOSIS — E785 Hyperlipidemia, unspecified: Secondary | ICD-10-CM | POA: Insufficient documentation

## 2023-09-03 DIAGNOSIS — Z6841 Body Mass Index (BMI) 40.0 and over, adult: Secondary | ICD-10-CM | POA: Insufficient documentation

## 2023-09-03 DIAGNOSIS — R928 Other abnormal and inconclusive findings on diagnostic imaging of breast: Secondary | ICD-10-CM | POA: Insufficient documentation

## 2023-09-03 DIAGNOSIS — N952 Postmenopausal atrophic vaginitis: Secondary | ICD-10-CM | POA: Insufficient documentation

## 2023-09-03 MED ORDER — DUPILUMAB 300 MG/2ML ~~LOC~~ SOAJ
300.0000 mg | Freq: Once | SUBCUTANEOUS | Status: AC
Start: 2023-09-03 — End: 2023-09-03
  Administered 2023-09-03: 300 mg via SUBCUTANEOUS

## 2023-09-03 MED ORDER — TACLONEX 0.005-0.064 % EX SUSP: Freq: Every day | CUTANEOUS | 0 refills | Status: DC

## 2023-09-03 NOTE — Patient Instructions (Addendum)

## 2023-09-03 NOTE — Progress Notes (Signed)
 Follow-Up Visit   Subjective  Natasha Donovan is a 58 y.o. female who presents for the following: Dupixent  Injection  Patient present today for Dupixent  Injection. Patient was last evaluated on 08/20/22. At this visit patient had Dupixent  Injection Completed. Patient reports sxs are Improved but she is still experiencing itching. Patient denies medication changes. She rates her itch 8 out of 10 [employer], a patient with a longstanding history of eczema, presented today for her 3rd set of Dupixent  injections. She has been on Dupixent  for approximately 6 weeks.  The patient reports improvement in itching since starting Dupixent . However, she continues to experience periodic urticarial plaques on her back and upper arms that have been resistant to both topical clobetasol  and Dupixent  treatment. While the duration of Dupixent  treatment has not been long enough for complete skin clearance, the persistence of these plaques has prompted further investigation.  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 Despite this, she has been adherent to her treatment regimen, attending her scheduled injection appointments.  The following portions of the chart were reviewed this encounter and updated as appropriate: medications, allergies, medical history  Review of Systems:  No other skin or systemic complaints except as noted in HPI or Assessment and Plan.  Objective  Well appearing patient in no apparent distress; mood and affect are within normal limits.  A full examination was performed including scalp, head, eyes, ears, nose, lips, neck, chest, axillae, abdomen, back, buttocks, bilateral upper extremities, bilateral lower extremities, hands, feet, fingers, toes, fingernails, and toenails. All findings within normal limits unless otherwise noted below.   Relevant exam findings are noted in the Assessment and Plan.          Right Upper  Arm - Posterior Urticarial involving upper back posterior Arms  Assessment & Plan   ATOPIC DERMATITIS Exam: Scaly pink papules coalescing to plaques 12% BSA IGA: 2  Improved but not at goal  Atopic dermatitis (eczema) is a chronic, relapsing, pruritic condition that can significantly affect quality of life. It is often associated with allergic rhinitis and/or asthma and can require treatment with topical medications, phototherapy, or in severe cases biologic injectable medication (Dupixent ; Adbry) or Oral JAK inhibitors.  Treatment Plan: - Recommend gentle skin care.  - Dupixent  Injection Completed while in office today, Patient tolerated well - Prescribed Taclonex to apply 2 times daily to the effected areas - Plan to follow up in 21 days for Bx results and Suture Removal  Dupixent   NDC: 7846-9629-52 EXP: 01/15/2025 LOT: [id number]  OTHER ATOPIC DERMATITIS   Related Medications predniSONE  (DELTASONE ) 10 MG tablet Take 1 tablet (10 mg total) by mouth daily with breakfast for 7 days. tacrolimus  (PROTOPIC ) 0.1 % ointment Apply topically 2 (two) times daily. Dupilumab  (DUPIXENT ) 300 MG/2ML SOAJ Inject 300 mg into the skin every 14 (fourteen) days. Starting at day 15 for maintenance. lidocaine  (XYLOCAINE ) 5 % ointment Apply 1 Application topically as needed. Apply 1 hour before you inject to the injection site. WIPE OFF PRIOR TO INJECTING clobetasol  ointment (TEMOVATE ) 0.05 % Apply topically 2 (two) times daily. Use for 2 weeks, then break for 2 weeks. Repeat PRN. Dupilumab  SOAJ 300 mg  TACLONEX external suspension Apply topically daily. RASH Right Upper Arm - Posterior Skin / nail biopsy - Right Upper Arm - Posterior Type of biopsy: punch   Informed consent: discussed and consent obtained   Timeout: patient name, date of birth, surgical site, and procedure  verified   Procedure prep:  Patient was prepped and draped in usual sterile fashion Prep type:  Isopropyl  alcohol Anesthesia: the lesion was anesthetized in a standard fashion   Anesthetic:  1% lidocaine  w/ epinephrine  1-100,000 buffered w/ 8.4% NaHCO3 Punch size:  4 mm Suture size:  4-0 Suture type: nylon   Suture removal (days):  21 Hemostasis achieved with: suture and aluminum chloride   Outcome: patient tolerated procedure well   Post-procedure details: sterile dressing applied and wound care instructions given   Dressing type: petrolatum gauze   Specimen 1 - Surgical pathology Differential Diagnosis: R/O Eczema vs Other  Check Margins: No  Return in about 3 weeks (around 09/24/2023) for Bx Results and Suture Removal F/U.  I, Jetta Ager, am acting as Neurosurgeon for [employer], DO.  Documentation: I have reviewed the above documentation for accuracy and completeness, and I agree with the above.  Louana Roup, DO

## 2023-09-05 LAB — SURGICAL PATHOLOGY

## 2023-09-11 ENCOUNTER — Ambulatory Visit: Payer: Self-pay | Admitting: Dermatology

## 2023-09-25 ENCOUNTER — Ambulatory Visit: Admitting: Dermatology

## 2023-09-30 ENCOUNTER — Ambulatory Visit: Admitting: Dermatology

## 2023-09-30 ENCOUNTER — Encounter: Payer: Self-pay | Admitting: Dermatology

## 2023-09-30 VITALS — BP 106/87

## 2023-09-30 DIAGNOSIS — L299 Pruritus, unspecified: Secondary | ICD-10-CM

## 2023-09-30 DIAGNOSIS — L2089 Other atopic dermatitis: Secondary | ICD-10-CM

## 2023-09-30 NOTE — Progress Notes (Signed)
   Follow-Up Visit   Subjective  Natasha Donovan is a 58 y.o. female who presents for FOLLOW UP:  Patient was last evaluated on 09/03/23.   Atopic Derm/Eczema: Pt here to discuss Bx results and suture removal. However, pt stated that sutures came out on their own. She is currently on Dupixent  and was Rx Talconex to apply BID to affected areas.    The following portions of the chart were reviewed this encounter and updated as appropriate: medications, allergies, medical history  Review of Systems:  No other skin or systemic complaints except as noted in HPI or Assessment and Plan.  Objective  Well appearing patient in no apparent distress; mood and affect are within normal limits.   A focused examination was performed of the following areas: upper body   Relevant exam findings are noted in the Assessment and Plan.    Assessment & Plan   ATOPIC DERMATITIS/ECZEMA Exam: Scaly pink papules coalescing to plaques % BSA  flared  - Assessment: Patient has been on Dupixent  for approximately 2 months for atopic dermatitis, confirmed by recent biopsy. Missed most recent injection, due about a week ago. Reports some improvement in skin condition, but still experiences itching, especially when exposed to sunlight. Physical examination reveals active inflammation. Previous consideration of losartan as a potential cause has been ruled out, as symptoms persisted after discontinuation. Condition assessed as intrinsic, related to patient's immune system. Dupixent  expected to be effective if taken consistently, with full evaluation of efficacy typically requiring 4-6 months of treatment.  - Plan:    Continue Dupixent  injections as prescribed     - Patient to administer next injection at home immediately     - Educate on importance of consistent administration    Topical treatments:     - Apply Taclonex , followed by moisturizer (A&D or Aveeno Eczema Therapy Balm)     - Option to mix Taclonex   with A&D if too thin     - Apply Aquaphor spray or Vaseline on top of other topical medications for added moisture    Additional care:     - Use Aveeno Oat Packets in bath 1-2 times per week    Future considerations:     - Consider JAK inhibitor if Dupixent  ineffective after full trial    Medication management:     - Continue monthly Dupixent  prescription refills   Return for atopic derm.   Documentation: I have reviewed the above documentation for accuracy and completeness, and I agree with the above.  I, Shirron Maranda, CMA, am acting as scribe for [employer], DO.   Delon Lenis, DO

## 2023-09-30 NOTE — Patient Instructions (Addendum)

## 2023-12-04 ENCOUNTER — Ambulatory Visit: Admitting: Dermatology

## 2024-02-16 ENCOUNTER — Encounter: Payer: Self-pay | Admitting: Dermatology

## 2024-02-17 ENCOUNTER — Other Ambulatory Visit: Payer: Self-pay | Admitting: Dermatology

## 2024-02-17 DIAGNOSIS — L2089 Other atopic dermatitis: Secondary | ICD-10-CM

## 2024-02-18 NOTE — Telephone Encounter (Signed)
 Hi Jetta,  Can you call this pt sometime today and get more informaiton.  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  Please inquire if she's having fever, chills, and blisters in her eczema areas.  If she she needs to go to the hospital and receive IV acyclovir (oral is not enough)  She should send over a few picture so we can help her trouble shoot.  Also, she should continue taking her dupixent  even if she's behind from a delays injection.    Lastly, you can send over a rx for clobetasol  if she needs that while she's waiting for the dupixent  to kick in (pending she doesn't have eczema herpeticum)

## 2024-02-19 NOTE — Telephone Encounter (Signed)
 Can you please see where we can add her in and/or have her call daily to work into a last minute cancellation slot.  She will be a good candidate for nemluvio.  Thanks

## 2024-03-01 NOTE — Telephone Encounter (Signed)
 I will definitely consider methotrexate but this has lots of side effects that need to be discussed.  So it's something we'll discuss at her next appointment.  She should definitely try calling to get in early in January

## 2024-03-04 ENCOUNTER — Encounter: Payer: Self-pay | Admitting: Dermatology

## 2024-03-04 ENCOUNTER — Ambulatory Visit: Admitting: Dermatology

## 2024-03-04 DIAGNOSIS — L2089 Other atopic dermatitis: Secondary | ICD-10-CM

## 2024-03-04 DIAGNOSIS — L309 Dermatitis, unspecified: Secondary | ICD-10-CM

## 2024-03-04 DIAGNOSIS — L299 Pruritus, unspecified: Secondary | ICD-10-CM

## 2024-03-04 DIAGNOSIS — Z79899 Other long term (current) drug therapy: Secondary | ICD-10-CM

## 2024-03-04 NOTE — Patient Instructions (Addendum)
 VISIT SUMMARY:  During today's visit, we discussed your worsening skin condition and joint pain. We reviewed your current treatments and considered new options to better manage your symptoms. We also addressed the potential side effects of new medications and the importance of regular monitoring.  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  Once I have access to all your recent labs (CBC, Quanteferon Gold, CMP, Hepatitis Panel) We will start you on methotrexate 10 mg once daily, with folic acid supplementation on non-methotrexate days, to help manage both your skin and joint symptoms.    Continue using clobetasol  with breaks, and use A&D cream and CeraVe anti-itch lotion for skin hydration.   Consider taking magnesium supplements for muscle cramping.  -HIGH RISK MEDICATION USE:  High risk medication use refers to the potential side effects of methotrexate, which can affect liver function. We will monitor your liver function tests regularly and ensure you take folic acid on non-methotrexate days to help mitigate these risks.  INSTRUCTIONS:  We will obtain lab results for TB (QuantiFERON Gold), hepatitis, and liver function tests before starting methotrexate. Once you start methotrexate, we will monitor your liver function every couple of months. Continue using clobetasol  with breaks, and use A&D cream and CeraVe anti-itch lotion for skin hydration. Consider taking magnesium supplements for muscle cramping.       Important Information  Due to recent changes in healthcare laws, you may see results of your pathology and/or laboratory studies on MyChart before the doctors have had a chance to review  them. We understand that in some cases there may be results that are confusing or concerning to you. Please understand that not all results are received at the same time and often the doctors may need to interpret multiple results in order to provide you with the best plan of care or course of treatment. Therefore, we ask that you please give us  2 business days to thoroughly review all your results before contacting the office for clarification. Should we see a critical lab result, you will be contacted sooner.   If You Need Anything After Your Visit  If you have any questions or concerns for your doctor, please call our main line at [phone] If no one answers, please leave a voicemail as directed and we will return your call as soon as possible. Messages left after 4 pm will be answered the following business day.   You may also send us  a message via MyChart. We typically respond to MyChart messages within 1-2 business days.  For prescription refills, please ask your pharmacy to contact our office. Our fax number is [phone].  If you have an urgent issue when the clinic is closed that cannot wait until the next business day, you can page your doctor at the number below.    Please note that while we do our best to be available for urgent issues outside of office hours, we are not available 24/7.   If you have an urgent issue and are unable to reach us , you may choose to seek medical care at your doctor's office, retail clinic, urgent care center, or emergency room.  If you have a medical emergency, please immediately call 911 or go to the emergency department. In the event  of inclement weather, please call our main line at [phone] for an update on the status of any delays or closures.  Dermatology Medication Tips: Please keep the boxes that topical medications come in in order to help keep track of the instructions about where and how to use these. Pharmacies typically print the  medication instructions only on the boxes and not directly on the medication tubes.   If your medication is too expensive, please contact our office at [phone] or send us  a message through MyChart.   We are unable to tell what your co-pay for medications will be in advance as this is different depending on your insurance coverage. However, we may be able to find a substitute medication at lower cost or fill out paperwork to get insurance to cover a needed medication.   If a prior authorization is required to get your medication covered by your insurance company, please allow us  1-2 business days to complete this process.  Drug prices often vary depending on where the prescription is filled and some pharmacies may offer cheaper prices.  The website www.goodrx.com contains coupons for medications through different pharmacies. The prices here do not account for what the cost may be with help from insurance (it may be cheaper with your insurance), but the website can give you the price if you did not use any insurance.  - You can print the associated coupon and take it with your prescription to the pharmacy.  - You may also stop by our office during regular business hours and pick up a GoodRx coupon card.  - If you need your prescription sent electronically to a different pharmacy, notify our office through [facility] or by phone at [phone]

## 2024-03-04 NOTE — Progress Notes (Signed)
" ° °  Follow-Up Visit   Subjective  Natasha Donovan is a 58 y.o. female established patient who presents for FOLLOW UP on the diagnoses listed below:  Patient (and/or pt guardian) consented to the use of AI-assisted tools for note generation.  Patient was last evaluated on 09/30/23.   Atopic Derm: Pt has been on Dupixent  therapy since 06/10/23. Pt stated that she was experiencing muscle cramping in her feet but now having joint pain at the shoulders and knees. She seen her PCP who did labs that proved she has a high rheumatoid factor but not high enough to be considered lupus or RA. Pt would like to discuss starting methotrexate as topicals and have Dupixent  have been ineffective. Pt has been trying A&D ointment to help with itch but it only helps for a short period.    Pt has tried and failed: Dupixent  05/2023 - current  Talconex suspension 08/2023 - 08/2023 Clobetasol  oint 0.05% 11/2022 - current Tacrolimus  0.1% 08/2022 - 11/2022 Opzelura  09/2022 - 09/2022 Pimercolimus 06/2022- 08/2022 Eucrisa  10/2021 - 06/2022   Are you nursing, pregnant or trying to conceive? \No   The following portions of the chart were reviewed this encounter and updated as appropriate: medications, allergies, medical history  Review of Systems:  No other skin or systemic complaints except as noted in HPI or Assessment and Plan.  Objective  Well appearing patient in no apparent distress; mood and affect are within normal limits.   A focused examination was performed of the following areas: scattered   Relevant exam findings are noted in the Assessment and Plan.                            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 Biopsy showed  features of both psoriasis and eczema, with a predominance of eczema. Dupixent  was ineffective, suggesting a psoriatic component. New onset joint pain and cramping, possibly related to psoriatic arthritis. Stress and electrolyte imbalance may contribute to symptoms. Methotrexate is considered for treatment, pending lab results for TB, hepatitis, and liver function tests. Methotrexate can help modify the immune system and address both skin and joint symptoms. If ineffective, a biologic targeting psoriasis may be considered. - Will obtain lab results for TB (QuantiFERON Gold), hepatitis, and liver function tests. - Will initiate methotrexate 10 mg once daily, with folic acid supplementation on non-methotrexate days. - Will monitor liver function every couple of months. - Continue clobetasol  with breaks to prevent stretch marks. - Use A and D cream and CeraVe anti-itch for skin hydration. - Consider magnesium supplementation for muscle cramping.  High risk medication use High risk medication use due to potential side effects of methotrexate, including liver function impairment. Requires monitoring and careful management. - Monitor liver function tests regularly. - Ensure folic acid supplementation on non-methotrexate days   No follow-ups on file.   Documentation: I have reviewed the above documentation for accuracy and completeness, and I agree with the above.  I, Shirron Maranda, CMA II, am acting as scribe for:  Delon Lenis, DO "

## 2024-03-13 ENCOUNTER — Other Ambulatory Visit: Payer: Self-pay

## 2024-03-13 ENCOUNTER — Encounter (HOSPITAL_BASED_OUTPATIENT_CLINIC_OR_DEPARTMENT_OTHER): Payer: Self-pay | Admitting: *Deleted

## 2024-03-13 ENCOUNTER — Emergency Department (HOSPITAL_BASED_OUTPATIENT_CLINIC_OR_DEPARTMENT_OTHER)
Admission: EM | Admit: 2024-03-13 | Discharge: 2024-03-13 | Disposition: A | Discharge status: Home / Self Care | Attending: Emergency Medicine | Admitting: Emergency Medicine

## 2024-03-13 DIAGNOSIS — L409 Psoriasis, unspecified: Secondary | ICD-10-CM | POA: Diagnosis not present

## 2024-03-13 DIAGNOSIS — I890 Lymphedema, not elsewhere classified: Secondary | ICD-10-CM | POA: Diagnosis not present

## 2024-03-13 DIAGNOSIS — Z91040 Latex allergy status: Secondary | ICD-10-CM | POA: Insufficient documentation

## 2024-03-13 DIAGNOSIS — R21 Rash and other nonspecific skin eruption: Secondary | ICD-10-CM | POA: Diagnosis present

## 2024-03-13 LAB — CBC WITH DIFFERENTIAL/PLATELET
Abs Immature Granulocytes: 0.03 K/uL (ref 0.00–0.07)
Basophils Absolute: 0 K/uL (ref 0.0–0.1)
Basophils Relative: 1 %
Eosinophils Absolute: 0.1 K/uL (ref 0.0–0.5)
Eosinophils Relative: 1 %
HCT: 33 % — ABNORMAL LOW (ref 36.0–46.0)
Hemoglobin: 10.9 g/dL — ABNORMAL LOW (ref 12.0–15.0)
Immature Granulocytes: 0 %
Lymphocytes Relative: 22 %
Lymphs Abs: 1.7 K/uL (ref 0.7–4.0)
MCH: 29.5 pg (ref 26.0–34.0)
MCHC: 33 g/dL (ref 30.0–36.0)
MCV: 89.4 fL (ref 80.0–100.0)
Monocytes Absolute: 0.5 K/uL (ref 0.1–1.0)
Monocytes Relative: 7 %
Neutro Abs: 5.5 K/uL (ref 1.7–7.7)
Neutrophils Relative %: 69 %
Platelets: 260 K/uL (ref 150–400)
RBC: 3.69 MIL/uL — ABNORMAL LOW (ref 3.87–5.11)
RDW: 15.4 % (ref 11.5–15.5)
WBC: 7.9 K/uL (ref 4.0–10.5)
nRBC: 0 % (ref 0.0–0.2)

## 2024-03-13 LAB — COMPREHENSIVE METABOLIC PANEL WITH GFR
ALT: 33 U/L (ref 0–44)
AST: 26 U/L (ref 15–41)
Albumin: 3.9 g/dL (ref 3.5–5.0)
Alkaline Phosphatase: 84 U/L (ref 38–126)
Anion gap: 12 (ref 5–15)
BUN: 17 mg/dL (ref 6–20)
CO2: 23 mmol/L (ref 22–32)
Calcium: 9.5 mg/dL (ref 8.9–10.3)
Chloride: 108 mmol/L (ref 98–111)
Creatinine, Ser: 0.95 mg/dL (ref 0.44–1.00)
GFR, Estimated: >60 mL/min
Glucose, Bld: 108 mg/dL — ABNORMAL HIGH (ref 70–99)
Potassium: 3.8 mmol/L (ref 3.5–5.1)
Sodium: 143 mmol/L (ref 135–145)
Total Bilirubin: <0.2 mg/dL (ref 0.0–1.2)
Total Protein: 6.7 g/dL (ref 6.5–8.1)

## 2024-03-13 LAB — HIV ANTIBODY (ROUTINE TESTING W REFLEX): HIV Screen 4th Generation wRfx: NONREACTIVE

## 2024-03-13 NOTE — ED Triage Notes (Addendum)
 Pt has had a rash all over body which is itchy since may and has been seen by her PCP and dermatologist but they were not sure what it is and she has had some swelling in her feet/ ankles which resolves when she elevates her feet but she states taht the rash has worse and the swelling is now in her legs.  No sob, no CP.

## 2024-03-13 NOTE — ED Provider Notes (Signed)
 " [facility] AT [facility] Provider Note   CSN: [id number] Arrival date & time: 03/13/24  1340     Patient presents with: Rash and Leg Swelling   Natasha Donovan is a 58 y.o. female.   This is a 58 year old female who is here today for rash all of her body which has been ongoing since May, seems to be worsening.  She also started to have some swelling in her legs.  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  She has not yet started this medicine.   Rash      Prior to Admission medications  Medication Sig Start Date End Date Taking? Authorizing Provider  BIOTIN PO Take 1 capsule by mouth.    [provider]  Cholecalciferol (VITAMIN D-3) 1000 UNITS CAPS Take 1,000 Units by mouth daily.    [provider]  cimetidine (TAGAMET HB) 200 MG tablet 1 tablet Orally twice a day for 30 days 11/30/21   [provider]  clobetasol  ointment (TEMOVATE ) 0.05 % APPLY TOPICALLY 2 TIMES DAILY FOR 2 WEEKS, THEN BREAK FOR 2 WEEKS. REPEAT AS NEEDED 03/04/24   Alm Delon SAILOR, DO  Dermatological Products, Misc. [facility]) lotion Apply twice a day to the body as a moisturizer 11/01/21   Luke Orlan HERO, DO  Dupilumab  (DUPIXENT ) 300 MG/2ML SOAJ Inject 300 mg into the skin every 14 (fourteen) days. Starting at day 15 for maintenance. 06/10/23   Alm Delon SAILOR, DO  fluconazole (DIFLUCAN) 200 MG tablet Take 200 mg by mouth once. 06/24/22   [provider]  gabapentin  (NEURONTIN ) 100 MG capsule Take by mouth. 12/15/18   [provider]  hydrOXYzine (ATARAX) 25 MG tablet 1-2 tablet at bedtime as needed Orally Once a day for 30 day(s) 06/14/21   [provider]  lidocaine  (XYLOCAINE ) 5 % ointment Apply 1 Application topically as needed. Apply 1 hour before you inject to the injection site. WIPE OFF PRIOR TO INJECTING 08/06/23   Alm Delon SAILOR, DO  Loratadine  (CLARITIN PO) Claritin    [provider]  Multiple Vitamin (MULTIVITAMIN) tablet Take 1 tablet by mouth daily.    [provider]  OZEMPIC, 0.25 OR 0.5 MG/DOSE, 2 MG/3ML SOPN SMARTSIG:0.25 Milligram(s) SUB-Q Once a Week 07/04/22   [provider]  predniSONE  (DELTASONE ) 10 MG tablet Take 1 tablet (10 mg total) by mouth daily with breakfast for 7 days. 09/03/22 03/04/24  Alm Delon SAILOR, DO  Probiotic Product (PROBIOTIC DAILY PO) Take 1 capsule by mouth daily.    [provider]  valACYclovir (VALTREX) 500 MG tablet Take 500 mg by mouth daily. 08/19/22   [provider]  vitamin B-12 (CYANOCOBALAMIN) 100 MCG tablet Take 100 mcg by mouth daily.    [provider]    Allergies: Latex, Hydroxyzine hcl, Other, and Semaglutide    Review of Systems  Skin:  Positive for rash.    Updated Vital Signs BP (!) 175/77   Pulse 95   Temp 98 F (36.7 C) (Oral)   Resp 18   Ht 5' 3.5 (1.613 m)   Wt 100.2 kg   LMP 09/30/2012   SpO2 100%   BMI 38.52 kg/m   Physical Exam Vitals and nursing note reviewed.  Constitutional:      Appearance: She is not toxic-appearing.  Eyes:     Pupils: Pupils are equal, round, and reactive to light.  Cardiovascular:  Rate and Rhythm: Normal rate.  Musculoskeletal:        General: Normal range of motion.     Cervical back: Normal range of motion.     Comments: 1+ lower extremity edema  Skin:    Comments: Diffuse plaques and excoriations over visible surfaces.  Appears to spare the palms, primarily over the dorsum and extensor surfaces.  There is no evidence of cellulitis, no crepitus.  Neurological:     Mental Status: She is alert.     (all labs ordered are listed, but only abnormal results are displayed) Labs Reviewed  COMPREHENSIVE METABOLIC PANEL WITH GFR - Abnormal; Notable for the following components:      Result Value   Glucose, Bld 108 (*)    All other components within normal limits  CBC  WITH DIFFERENTIAL/PLATELET - Abnormal; Notable for the following components:   RBC 3.69 (*)    Hemoglobin 10.9 (*)    HCT 33.0 (*)    All other components within normal limits  HIV ANTIBODY (ROUTINE TESTING W REFLEX)  SYPHILIS: RPR W/REFLEX TO RPR TITER AND TREPONEMAL ANTIBODIES, TRADITIONAL SCREENING AND DIAGNOSIS ALGORITHM    EKG: None  Radiology: No results found.   Ultrasound ED Echo  Date/Time: 03/13/2024 6:12 PM  Performed by: Mannie Fairy DASEN, DO Authorized by: Mannie Fairy DASEN, DO   Procedure details:    Indications: dyspnea     Views: parasternal long axis view and parasternal short axis view     Images: archived     Limitations:  Body habitus Findings:    Pericardium: no pericardial effusion     LV Function: normal (>50% EF)     RV Diameter: normal   Impression:    Impression: normal      Medications Ordered in the ED - No data to display                                  Medical Decision Making 58 year old female who is here today for rash and leg swelling.  Differential diagnoses include psoriasis, rheumatoid psoriasis, less likely systemic infection, consider heart failure, lymphedema.  Plan -patient's rash is chronic in nature, currently being worked up by dermatology.  Does not appear to be an acute emergent rash today.  Regarding the swelling in her legs, it is bilateral, does not appear to be cellulitis, low suspicion for DVT.  Performed bedside ultrasound the patient which showed a normal EF, lower suspicion for heart failure.  Advised patient on compression socks.  She will follow-up with her PCP regarding this.  Patient's blood pressure elevated here in the ED.  States that she had a physical within the last 1 to 2 weeks, has not had issues with her blood pressure previously.  She is going to monitor and follow-up with her PCP regarding this.        Final diagnoses:  Lymphedema  Psoriasis    ED Discharge Orders     None           Mannie Fairy DASEN, DO 03/13/24 1815  "

## 2024-03-13 NOTE — Discharge Instructions (Signed)
 You may begin wearing compression socks over your legs to help with the swelling.  Please follow-up with your primary care doctor to discuss your blood pressure as well as lymphedema.  Please continue to follow along with your dermatologist for recommendations for your rash.  Unfortunately, we do not have medications available from the emergency room that would be helpful.  You may continue to apply over-the-counter lotions to the area, and use medicine such as antihistamines to help with itching.

## 2024-03-14 LAB — SYPHILIS: RPR W/REFLEX TO RPR TITER AND TREPONEMAL ANTIBODIES, TRADITIONAL SCREENING AND DIAGNOSIS ALGORITHM: RPR Ser Ql: NONREACTIVE

## 2024-05-12 ENCOUNTER — Ambulatory Visit
